# Patient Record
Sex: Female | Born: 1995 | Race: White | Hispanic: No | Marital: Single | Smoking: Never smoker
Health system: Southern US, Community
[De-identification: ages and names within clinical notes are randomized; demographics above are authoritative.]

## PROBLEM LIST (undated history)

## (undated) DIAGNOSIS — T7840XA Allergy, unspecified, initial encounter: Secondary | ICD-10-CM

## (undated) DIAGNOSIS — F419 Anxiety disorder, unspecified: Secondary | ICD-10-CM

## (undated) DIAGNOSIS — J45909 Unspecified asthma, uncomplicated: Secondary | ICD-10-CM

## (undated) HISTORY — PX: APPENDECTOMY: SHX54

## (undated) HISTORY — DX: Anxiety disorder, unspecified: F41.9

## (undated) HISTORY — DX: Allergy, unspecified, initial encounter: T78.40XA

---
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
## 2022-03-04 ENCOUNTER — Encounter (HOSPITAL_COMMUNITY): Payer: Self-pay

## 2022-03-04 ENCOUNTER — Emergency Department (HOSPITAL_COMMUNITY)
Admission: EM | Admit: 2022-03-04 | Discharge: 2022-03-04 | Disposition: A | Discharge status: Home / Self Care | Payer: Self-pay | Attending: Emergency Medicine | Admitting: Emergency Medicine

## 2022-03-04 ENCOUNTER — Other Ambulatory Visit: Payer: Self-pay

## 2022-03-04 ENCOUNTER — Emergency Department (HOSPITAL_COMMUNITY): Payer: Self-pay

## 2022-03-04 DIAGNOSIS — R112 Nausea with vomiting, unspecified: Secondary | ICD-10-CM | POA: Insufficient documentation

## 2022-03-04 DIAGNOSIS — K529 Noninfective gastroenteritis and colitis, unspecified: Secondary | ICD-10-CM | POA: Insufficient documentation

## 2022-03-04 DIAGNOSIS — R0981 Nasal congestion: Secondary | ICD-10-CM | POA: Insufficient documentation

## 2022-03-04 DIAGNOSIS — R824 Acetonuria: Secondary | ICD-10-CM | POA: Insufficient documentation

## 2022-03-04 DIAGNOSIS — J45909 Unspecified asthma, uncomplicated: Secondary | ICD-10-CM | POA: Insufficient documentation

## 2022-03-04 LAB — COMPREHENSIVE METABOLIC PANEL
ALT: 18 U/L (ref 0–44)
AST: 21 U/L (ref 15–41)
Albumin: 4.3 g/dL (ref 3.5–5.0)
Alkaline Phosphatase: 48 U/L (ref 38–126)
Anion gap: 12 (ref 5–15)
BUN: 7 mg/dL (ref 6–20)
CO2: 19 mmol/L — ABNORMAL LOW (ref 22–32)
Calcium: 9.1 mg/dL (ref 8.9–10.3)
Chloride: 106 mmol/L (ref 98–111)
Creatinine, Ser: 0.96 mg/dL (ref 0.44–1.00)
GFR, Estimated: >60 mL/min (ref >60)
Glucose, Bld: 106 mg/dL — ABNORMAL HIGH (ref 70–99)
Potassium: 3.6 mmol/L (ref 3.5–5.1)
Sodium: 137 mmol/L (ref 135–145)
Total Bilirubin: 1.1 mg/dL (ref 0.3–1.2)
Total Protein: 6.9 g/dL (ref 6.5–8.1)

## 2022-03-04 LAB — CBC WITH DIFFERENTIAL/PLATELET
Abs Immature Granulocytes: 0.04 10*3/uL (ref 0.00–0.07)
Basophils Absolute: 0 10*3/uL (ref 0.0–0.1)
Basophils Relative: 0 %
Eosinophils Absolute: 0 10*3/uL (ref 0.0–0.5)
Eosinophils Relative: 0 %
HCT: 42 % (ref 36.0–46.0)
Hemoglobin: 14.1 g/dL (ref 12.0–15.0)
Immature Granulocytes: 0 %
Lymphocytes Relative: 8 %
Lymphs Abs: 0.7 10*3/uL (ref 0.7–4.0)
MCH: 30.9 pg (ref 26.0–34.0)
MCHC: 33.6 g/dL (ref 30.0–36.0)
MCV: 92.1 fL (ref 80.0–100.0)
Monocytes Absolute: 0.7 10*3/uL (ref 0.1–1.0)
Monocytes Relative: 7 %
Neutro Abs: 8.2 10*3/uL — ABNORMAL HIGH (ref 1.7–7.7)
Neutrophils Relative %: 85 %
Platelets: 243 10*3/uL (ref 150–400)
RBC: 4.56 MIL/uL (ref 3.87–5.11)
RDW: 13.2 % (ref 11.5–15.5)
WBC: 9.7 10*3/uL (ref 4.0–10.5)
nRBC: 0 % (ref 0.0–0.2)

## 2022-03-04 LAB — URINALYSIS, ROUTINE W REFLEX MICROSCOPIC
Bilirubin Urine: NEGATIVE
Glucose, UA: NEGATIVE mg/dL
Ketones, ur: 80 mg/dL — AB
Leukocytes,Ua: NEGATIVE
Nitrite: NEGATIVE
Protein, ur: NEGATIVE mg/dL
Specific Gravity, Urine: 1.005 (ref 1.005–1.030)
pH: 6 (ref 5.0–8.0)

## 2022-03-04 LAB — LIPASE, BLOOD: Lipase: 24 U/L (ref 11–51)

## 2022-03-04 LAB — I-STAT BETA HCG BLOOD, ED (MC, WL, AP ONLY): I-stat hCG, quantitative: <5 m[IU]/mL (ref <5)

## 2022-03-04 MED ORDER — CIPROFLOXACIN HCL 500 MG PO TABS: 500.0000 mg | ORAL_TABLET | Freq: Two times a day (BID) | ORAL | 0 refills | Status: DC

## 2022-03-04 MED ORDER — CIPROFLOXACIN IN D5W 400 MG/200ML IV SOLN
400.0000 mg | Freq: Once | INTRAVENOUS | Status: AC
  Administered 2022-03-04: 400 mg via INTRAVENOUS
  Filled 2022-03-04: qty 200

## 2022-03-04 MED ORDER — IOHEXOL 300 MG/ML  SOLN
100.0000 mL | Freq: Once | INTRAMUSCULAR | Status: AC | PRN
  Administered 2022-03-04: 100 mL via INTRAVENOUS

## 2022-03-04 MED ORDER — METOCLOPRAMIDE HCL 5 MG/ML IJ SOLN
10.0000 mg | Freq: Once | INTRAMUSCULAR | Status: AC
  Administered 2022-03-04: 10 mg via INTRAVENOUS
  Filled 2022-03-04: qty 2

## 2022-03-04 MED ORDER — SODIUM CHLORIDE 0.9 % IV SOLN
25.0000 mg | Freq: Once | INTRAVENOUS | Status: AC
  Administered 2022-03-04: 25 mg via INTRAVENOUS
  Filled 2022-03-04: qty 1

## 2022-03-04 MED ORDER — METRONIDAZOLE 500 MG/100ML IV SOLN
500.0000 mg | Freq: Once | INTRAVENOUS | Status: AC
  Administered 2022-03-04: 500 mg via INTRAVENOUS
  Filled 2022-03-04: qty 100

## 2022-03-04 MED ORDER — SODIUM CHLORIDE 0.9 % IV SOLN: INTRAVENOUS | Status: DC

## 2022-03-04 MED ORDER — LACTATED RINGERS IV BOLUS
1000.0000 mL | Freq: Once | INTRAVENOUS | Status: AC
Start: 2022-03-04 — End: 2022-03-04
  Administered 2022-03-04: 1000 mL via INTRAVENOUS

## 2022-03-04 MED ORDER — ONDANSETRON HCL 4 MG/2ML IJ SOLN
INTRAMUSCULAR | Status: AC
  Filled 2022-03-04: qty 2

## 2022-03-04 MED ORDER — PROMETHAZINE HCL 25 MG PO TABS: 25.0000 mg | ORAL_TABLET | Freq: Three times a day (TID) | ORAL | 0 refills | Status: DC | PRN

## 2022-03-04 MED ORDER — ONDANSETRON 4 MG PO TBDP: 4.0000 mg | ORAL_TABLET | Freq: Three times a day (TID) | ORAL | 0 refills | Status: DC | PRN

## 2022-03-04 MED ORDER — METRONIDAZOLE 500 MG PO TABS: 500.0000 mg | ORAL_TABLET | Freq: Two times a day (BID) | ORAL | 0 refills | Status: AC

## 2022-03-04 MED ORDER — SODIUM CHLORIDE 0.9 % IV BOLUS
1000.0000 mL | Freq: Once | INTRAVENOUS | Status: AC
  Administered 2022-03-04: 1000 mL via INTRAVENOUS

## 2022-03-04 MED ORDER — ONDANSETRON HCL 4 MG/2ML IJ SOLN
4.0000 mg | Freq: Once | INTRAMUSCULAR | Status: AC
  Administered 2022-03-04: 4 mg via INTRAVENOUS
  Filled 2022-03-04: qty 2

## 2022-03-04 MED ORDER — DICYCLOMINE HCL 10 MG/ML IM SOLN
20.0000 mg | Freq: Once | INTRAMUSCULAR | Status: AC
  Administered 2022-03-04: 20 mg via INTRAMUSCULAR
  Filled 2022-03-04: qty 2

## 2022-03-04 NOTE — Discharge Instructions (Signed)
As discussed, today's evaluation has been consistent with gastritis or irritation of your large intestine.  This is a somewhat unusual finding in a young healthy individual.  Please be sure to call our gastroenterology colleagues to arrange appropriate follow-up.  Return here for concerning changes in your condition.

## 2022-03-04 NOTE — ED Triage Notes (Signed)
Pt arrives POV with complaints of n/v/d x 2 months with worsening sx this week. Pt states that she has not been able to eat or drink or hold any of the medications down that she was given at [facility] yesterday. Pt complains of cough and abdominal cramping as well.

## 2022-03-04 NOTE — ED Provider Notes (Signed)
[facility] EMERGENCY DEPARTMENT Provider Note   CSN: [id number] Arrival date & time: 03/04/22  0847     History  Chief Complaint  Patient presents with   Abdominal Pain   Nausea    Makayla Obrien is a 26 y.o. female.  HPI 26 yo female with history of asthma presents with cough for 2 days and persistent epigastric pain with n/v/d for 2 months. The patient was seen at urgent care 1 week ago for n/v/d and was diagnosed with IBS, she was given medication for nausea but was unable to hold it down due to vomiting. She returned to urgent care 2 days ago because she developed a cough and was given benzonatate, azithromycin, prednisone, and pantoprazole. She also had labs drawn for a GI infection which are still pending. She has not been able to hold down any medications. Patient also complains of fever, chills, nasal congestion. Denies chest pain, dysuria, hematuria. Previous surgeries include appendectomy in 2007.     Home Medications Prior to Admission medications   Medication Sig Start Date End Date Taking? Authorizing Provider  ciprofloxacin (CIPRO) 500 MG tablet Take 1 tablet (500 mg total) by mouth every 12 (twelve) hours. 03/04/22  Yes Gerhard Munch, MD  metroNIDAZOLE (FLAGYL) 500 MG tablet Take 1 tablet (500 mg total) by mouth 2 (two) times daily for 5 days. 03/04/22 03/09/22 Yes Gerhard Munch, MD  ondansetron (ZOFRAN-ODT) 4 MG disintegrating tablet Take 1 tablet (4 mg total) by mouth every 8 (eight) hours as needed for nausea or vomiting. 03/04/22  Yes Gerhard Munch, MD      Allergies    Patient has no known allergies.    Review of Systems   Review of Systems  Constitutional:        Per HPI, otherwise negative  HENT:         Per HPI, otherwise negative  Respiratory:         Per HPI, otherwise negative  Cardiovascular:        Per HPI, otherwise negative  Gastrointestinal:  Positive for nausea and vomiting.  Endocrine:       Negative aside from HPI   Genitourinary:        Neg aside from HPI   Musculoskeletal:        Per HPI, otherwise negative  Skin: Negative.   Neurological:  Negative for syncope.   Physical Exam Updated Vital Signs BP (!) 126/99   Pulse 79   Temp 98.4 F (36.9 C) (Oral)   Resp 16   Ht 5\' 6"  (1.676 m)   Wt 68.9 kg   LMP 02/24/2022   SpO2 99%   BMI 24.53 kg/m  Physical Exam Vitals and nursing note reviewed.  Constitutional:      General: She is not in acute distress.    Appearance: She is well-developed.  HENT:     Head: Normocephalic and atraumatic.  Eyes:     Conjunctiva/sclera: Conjunctivae normal.  Cardiovascular:     Rate and Rhythm: Normal rate and regular rhythm.  Pulmonary:     Effort: Pulmonary effort is normal. No respiratory distress.     Breath sounds: Normal breath sounds. No stridor.  Abdominal:     General: There is no distension.     Tenderness: There is generalized abdominal tenderness. There is guarding.  Skin:    General: Skin is warm and dry.  Neurological:     Mental Status: She is alert and oriented to person, place, and time.  Cranial Nerves: No cranial nerve deficit.  Psychiatric:        Mood and Affect: Mood normal.    ED Results / Procedures / Treatments   Labs (all labs ordered are listed, but only abnormal results are displayed) Labs Reviewed  COMPREHENSIVE METABOLIC PANEL - Abnormal; Notable for the following components:      Result Value   CO2 19 (*)    Glucose, Bld 106 (*)    All other components within normal limits  CBC WITH DIFFERENTIAL/PLATELET - Abnormal; Notable for the following components:   Neutro Abs 8.2 (*)    All other components within normal limits  URINALYSIS, ROUTINE W REFLEX MICROSCOPIC - Abnormal; Notable for the following components:   Color, Urine STRAW (*)    Hgb urine dipstick MODERATE (*)    Ketones, ur 80 (*)    Bacteria, UA RARE (*)    All other components within normal limits  LIPASE, BLOOD  I-STAT BETA HCG BLOOD, ED  ([facility], [facility], [facility] ONLY)    EKG None  Radiology DG Chest 2 View  Result Date: 03/04/2022 CLINICAL DATA:  cough EXAM: CHEST - 2 VIEW COMPARISON:  Chest radiograph dated October 14, 2014 FINDINGS: The cardiomediastinal silhouette is normal in contour. No pleural effusion. No pneumothorax. No acute pleuroparenchymal abnormality. Visualized abdomen is unremarkable. Dextroscoliosis of the thoracic spine. IMPRESSION: No acute cardiopulmonary abnormality. Electronically Signed   By: Meda KlinefelterStephanie  Peacock M.D.   On: 03/04/2022 11:14   CT ABDOMEN PELVIS W CONTRAST  Result Date: 03/04/2022 CLINICAL DATA:  Postop nausea and vomiting. EXAM: CT ABDOMEN AND PELVIS WITH CONTRAST TECHNIQUE: Multidetector CT imaging of the abdomen and pelvis was performed using the standard protocol following bolus administration of intravenous contrast. RADIATION DOSE REDUCTION: This exam was performed according to the departmental dose-optimization program which includes automated exposure control, adjustment of the mA and/or kV according to patient size and/or use of iterative reconstruction technique. CONTRAST:  100mL OMNIPAQUE IOHEXOL 300 MG/ML  SOLN COMPARISON:  None Available. FINDINGS: Lower chest: Patchy ground-glass nodular opacities in the left lung base are likely infectious or inflammatory. Hepatobiliary: Suspicious hepatic lesion. Gallbladder is unremarkable. No biliary ductal dilation. Pancreas: No pancreatic ductal dilation or evidence of acute inflammation. Spleen: Normal in size without focal abnormality. Adrenals/Urinary Tract: Bilateral adrenal glands appear normal. No hydronephrosis. Kidneys demonstrate symmetric enhancement. Urinary bladder is unremarkable for degree of distension. Stomach/Bowel: No radiopaque enteric contrast material was administered. Stomach is unremarkable for degree of distension. No pathologic dilation of small or large bowel. Appendix appears surgically absent. Mild wall thickening of the ascending and  transverse colon some adjacent inflammation. Vascular/Lymphatic: No significant vascular findings are present. No enlarged abdominal or pelvic lymph nodes. Reproductive: Intrauterine device in place. No suspicious adnexal mass. Other: Trace pelvic free fluid is within physiologic normal limits. Musculoskeletal: No acute or significant osseous findings. IMPRESSION: 1. Mild ascending and transverse colonic wall thickening with some adjacent stranding may reflect mild colitis. 2. Patchy nodular ground-glass opacities in the left lung base are likely infectious or inflammatory. Electronically Signed   By: Maudry MayhewJeffrey  Waltz M.D.   On: 03/04/2022 11:27    Procedures Procedures    Medications Ordered in ED Medications  sodium chloride 0.9 % bolus 1,000 mL (0 mLs Intravenous Stopped 03/04/22 1527)    And  0.9 %  sodium chloride infusion ( Intravenous New Bag/Given 03/04/22 1307)  ondansetron (ZOFRAN) 4 MG/2ML injection (has no administration in time range)  ondansetron (ZOFRAN) injection 4 mg (4 mg  Intravenous Given 03/04/22 1000)  dicyclomine (BENTYL) injection 20 mg (20 mg Intramuscular Given 03/04/22 1253)  iohexol (OMNIPAQUE) 300 MG/ML solution 100 mL (100 mLs Intravenous Contrast Given 03/04/22 1119)  ciprofloxacin (CIPRO) IVPB 400 mg (0 mg Intravenous Stopped 03/04/22 1409)  metroNIDAZOLE (FLAGYL) IVPB 500 mg (0 mg Intravenous Stopped 03/04/22 1522)  lactated ringers bolus 1,000 mL (1,000 mLs Intravenous New Bag/Given 03/04/22 1526)  metoCLOPramide (REGLAN) injection 10 mg (10 mg Intravenous Given 03/04/22 1525)    ED Course/ Medical Decision Making/ A&P  This patient with a Hx of asthma presents to the ED for concern of abdominal pain, nausea, vomiting, cough, this involves an extensive number of treatment options, and is a complaint that carries with it a high risk of complications and morbidity.    The differential diagnosis includes both intrathoracic and intra-abdominal pathology including pneumonia,  gastroesophageal etiology, hepatobiliary dysfunction perforation   Social Determinants of Health:  No limits  Additional history obtained:  Additional history and/or information obtained from mother, notable for HPI, history of prior testing, and medications   After the initial evaluation, orders, including: Labs x-ray CT fluids Bentyl antiemetics were initiated.   Patient placed on Cardiac and Pulse-Oximetry Monitors. The patient was maintained on a cardiac monitor.  The cardiac monitored showed an rhythm of 75 sinus normal The patient was also maintained on pulse oximetry. The readings were typically 100% room air normal   On repeat evaluation of the patient improved 4:33 PM Patient with marked better, awake, alert, hemodynamically unremarkable Lab Tests:  I personally interpreted labs.  The pertinent results include: Ketonuria  Imaging Studies ordered:  I independently visualized and interpreted imaging which showed colitis I agree with the radiologist interpretation   Dispostion / Final MDM:  After consideration of the diagnostic results and the patient's response to treatment, this young adult female is presenting with ongoing abdominal pain, nausea, anorexia.  After several urgent care visits, with ongoing symptoms, she has not yet seen a GI.  Presumptive diagnosis of IBS remains a consideration, here patient found to have evidence for colitis, no perforation, no abscess on CT.  She is afebrile, no leukocytosis, started on antibiotics, though, as above, GI follow-up with IBS eval is a suggestion, patient and her mother were amenable to this.  After substantial fluid resuscitation here, with similar improvement in her clinical condition, no distress, no additional vomiting, patient discharged in stable condition.   Final Clinical Impression(s) / ED Diagnoses Final diagnoses:  Colitis    Rx / DC Orders ED Discharge Orders          Ordered    ciprofloxacin (CIPRO)  500 MG tablet  Every 12 hours        03/04/22 1632    metroNIDAZOLE (FLAGYL) 500 MG tablet  2 times daily        03/04/22 1632    ondansetron (ZOFRAN-ODT) 4 MG disintegrating tablet  Every 8 hours PRN        03/04/22 1632              Gerhard Munch, MD 03/04/22 [phone]

## 2022-03-05 ENCOUNTER — Encounter (HOSPITAL_COMMUNITY): Payer: Self-pay | Admitting: Emergency Medicine

## 2022-03-05 ENCOUNTER — Emergency Department (HOSPITAL_COMMUNITY): Payer: PRIVATE HEALTH INSURANCE

## 2022-03-05 ENCOUNTER — Emergency Department (HOSPITAL_COMMUNITY)
Admission: EM | Admit: 2022-03-05 | Discharge: 2022-03-06 | Disposition: A | Discharge status: Home / Self Care | Payer: PRIVATE HEALTH INSURANCE | Attending: Emergency Medicine | Admitting: Emergency Medicine

## 2022-03-05 ENCOUNTER — Other Ambulatory Visit: Payer: Self-pay

## 2022-03-05 DIAGNOSIS — J189 Pneumonia, unspecified organism: Secondary | ICD-10-CM | POA: Insufficient documentation

## 2022-03-05 DIAGNOSIS — R112 Nausea with vomiting, unspecified: Secondary | ICD-10-CM

## 2022-03-05 DIAGNOSIS — K529 Noninfective gastroenteritis and colitis, unspecified: Secondary | ICD-10-CM | POA: Insufficient documentation

## 2022-03-05 LAB — COMPREHENSIVE METABOLIC PANEL
ALT: 19 U/L (ref 0–44)
AST: 26 U/L (ref 15–41)
Albumin: 3.9 g/dL (ref 3.5–5.0)
Alkaline Phosphatase: 43 U/L (ref 38–126)
Anion gap: 11 (ref 5–15)
BUN: 8 mg/dL (ref 6–20)
CO2: 20 mmol/L — ABNORMAL LOW (ref 22–32)
Calcium: 8.9 mg/dL (ref 8.9–10.3)
Chloride: 108 mmol/L (ref 98–111)
Creatinine, Ser: 0.97 mg/dL (ref 0.44–1.00)
GFR, Estimated: >60 mL/min (ref >60)
Glucose, Bld: 107 mg/dL — ABNORMAL HIGH (ref 70–99)
Potassium: 3.5 mmol/L (ref 3.5–5.1)
Sodium: 139 mmol/L (ref 135–145)
Total Bilirubin: 0.8 mg/dL (ref 0.3–1.2)
Total Protein: 6.5 g/dL (ref 6.5–8.1)

## 2022-03-05 LAB — CBC WITH DIFFERENTIAL/PLATELET
Abs Immature Granulocytes: 0.02 10*3/uL (ref 0.00–0.07)
Basophils Absolute: 0 10*3/uL (ref 0.0–0.1)
Basophils Relative: 0 %
Eosinophils Absolute: 0.2 10*3/uL (ref 0.0–0.5)
Eosinophils Relative: 2 %
HCT: 42.2 % (ref 36.0–46.0)
Hemoglobin: 14.1 g/dL (ref 12.0–15.0)
Immature Granulocytes: 0 %
Lymphocytes Relative: 26 %
Lymphs Abs: 2.4 10*3/uL (ref 0.7–4.0)
MCH: 30.9 pg (ref 26.0–34.0)
MCHC: 33.4 g/dL (ref 30.0–36.0)
MCV: 92.5 fL (ref 80.0–100.0)
Monocytes Absolute: 1.1 10*3/uL — ABNORMAL HIGH (ref 0.1–1.0)
Monocytes Relative: 12 %
Neutro Abs: 5.6 10*3/uL (ref 1.7–7.7)
Neutrophils Relative %: 60 %
Platelets: 263 10*3/uL (ref 150–400)
RBC: 4.56 MIL/uL (ref 3.87–5.11)
RDW: 13.2 % (ref 11.5–15.5)
WBC: 9.3 10*3/uL (ref 4.0–10.5)
nRBC: 0 % (ref 0.0–0.2)

## 2022-03-05 LAB — URINALYSIS, ROUTINE W REFLEX MICROSCOPIC
Bilirubin Urine: NEGATIVE
Glucose, UA: NEGATIVE mg/dL
Ketones, ur: 5 mg/dL — AB
Nitrite: NEGATIVE
Protein, ur: NEGATIVE mg/dL
Specific Gravity, Urine: 1.021 (ref 1.005–1.030)
pH: 7 (ref 5.0–8.0)

## 2022-03-05 NOTE — ED Provider Triage Note (Signed)
Emergency Medicine Provider Triage Evaluation Note  Chastelyn Athens , a 26 y.o. female  was evaluated in triage.  Pt complains of abdominal pain.  Patient is recently been seen multiple times since her urgent care exam yesterday here in the emergency department.  Persistent since her discharge.  She has been consistently nauseous and vomiting and has not been able to keep her antibiotic she was discharged with.  She also notes continued diarrhea.  She also notes excessive coughing for the past 4 days with possible aspiration.  Denies fever, chills, night sweats  Review of Systems  Positive: See above Negative:   Physical Exam  Ht 5\' 6"  (1.676 m)   Wt 76 kg   LMP 02/24/2022   BMI 27.04 kg/m  Gen:   Awake, no distress   Resp:  Normal effort.  Faint wheeze noted upon auscultation.   MSK:   Moves extremities without difficulty  Other:  Epigastric abdominal tenderness  Medical Decision Making  Medically screening exam initiated at 10:03 PM.  Appropriate orders placed.  Itzabella Sorrels was informed that the remainder of the evaluation will be completed by another provider, this initial triage assessment does not replace that evaluation, and the importance of remaining in the ED until their evaluation is complete.     Arlana Lindau, Peter Garter 03/05/22 2205

## 2022-03-05 NOTE — ED Triage Notes (Signed)
Patient reports persistent upper abdominal pain with emesis and diarrhea for several weeks diagnosed with colitis here yesterday prescribed with medications with no improvement , denies fever or chills.

## 2022-03-06 ENCOUNTER — Encounter: Payer: Self-pay | Admitting: Physician Assistant

## 2022-03-06 MED ORDER — AMOXICILLIN-POT CLAVULANATE 875-125 MG PO TABS: 1.0000 | ORAL_TABLET | Freq: Once | ORAL | Status: DC

## 2022-03-06 MED ORDER — LACTATED RINGERS IV BOLUS: 1000.0000 mL | Freq: Once | INTRAVENOUS | Status: DC

## 2022-03-06 MED ORDER — METOCLOPRAMIDE HCL 5 MG/ML IJ SOLN
10.0000 mg | Freq: Once | INTRAMUSCULAR | Status: AC
Start: 2022-03-06 — End: 2022-03-06
  Administered 2022-03-06: 10 mg via INTRAVENOUS
  Filled 2022-03-06: qty 2

## 2022-03-06 MED ORDER — METOCLOPRAMIDE HCL 10 MG PO TABS: 10.0000 mg | ORAL_TABLET | Freq: Four times a day (QID) | ORAL | 0 refills | Status: DC

## 2022-03-06 MED ORDER — LACTATED RINGERS IV BOLUS
1000.0000 mL | Freq: Once | INTRAVENOUS | Status: AC
  Administered 2022-03-06: 1000 mL via INTRAVENOUS

## 2022-03-06 MED ORDER — METRONIDAZOLE 500 MG PO TABS
500.0000 mg | ORAL_TABLET | Freq: Once | ORAL | Status: AC
  Administered 2022-03-06: 500 mg via ORAL
  Filled 2022-03-06: qty 1

## 2022-03-06 MED ORDER — CIPROFLOXACIN HCL 500 MG PO TABS
500.0000 mg | ORAL_TABLET | Freq: Once | ORAL | Status: AC
Start: 2022-03-06 — End: 2022-03-06
  Administered 2022-03-06: 500 mg via ORAL
  Filled 2022-03-06: qty 1

## 2022-03-06 NOTE — ED Notes (Signed)
Provided pt with water and crackers for PO challenge.  

## 2022-03-06 NOTE — ED Provider Notes (Signed)
[facility] EMERGENCY DEPARTMENT Provider Note   CSN: [id number] Arrival date & time: 03/05/22  2133     History  Chief Complaint  Patient presents with   Abdominal Pain   Emesis    Diarrhea    Makayla Obrien is a 26 y.o. female.  Patient presents to the emergency department for abdominal pain, nausea, vomiting, and diarrhea.  Of note, patient was seen in the emergency department 2 days ago and had a CT imaging at that time which demonstrated basilar pneumonia as well as mild colitis.  Patient has had GI symptoms intermittently over the past 2 months but more persistently in the past 2 weeks.  This includes vomiting every day and having several episodes of watery, nonbloody diarrhea.  She does report recent travel, however her symptoms started prior to this.  She has intermittent pain in the epigastrium.  No fevers.  Several days ago she developed a cough and was seen at an urgent care.  She was started on a azithromycin.  She has been unable to keep down her medications over the past 2 days and states that she has been vomiting after eating and drinking.  She is kept down very small amounts of fluids.  She continues to have diarrhea.  She states that she feels lightheaded with standing.  No chest pain or shortness of breath.  She denies history of inflammatory bowel disease.  She denies heavy NSAID use or use of alcohol.  Reports several negative pregnancy test recently.      Home Medications Prior to Admission medications   Medication Sig Start Date End Date Taking? Authorizing Provider  ciprofloxacin (CIPRO) 500 MG tablet Take 1 tablet (500 mg total) by mouth every 12 (twelve) hours. 03/04/22   Gerhard Munch, MD  metroNIDAZOLE (FLAGYL) 500 MG tablet Take 1 tablet (500 mg total) by mouth 2 (two) times daily for 5 days. 03/04/22 03/09/22  Gerhard Munch, MD  ondansetron (ZOFRAN-ODT) 4 MG disintegrating tablet Take 1 tablet (4 mg total) by mouth every 8 (eight)  hours as needed for nausea or vomiting. 03/04/22   Gerhard Munch, MD  promethazine (PHENERGAN) 25 MG tablet Take 1 tablet (25 mg total) by mouth every 8 (eight) hours as needed for nausea or vomiting. 03/04/22   Tegeler, Canary Brim, MD      Allergies    Patient has no known allergies.    Review of Systems   Review of Systems  Physical Exam Updated Vital Signs BP 137/74 (BP Location: Right Arm)   Pulse 81   Temp 98.3 F (36.8 C)   Resp 18   Ht 5\' 6"  (1.676 m)   Wt 76 kg   LMP 02/24/2022   SpO2 100%   BMI 27.04 kg/m  Physical Exam Vitals and nursing note reviewed.  Constitutional:      General: She is not in acute distress.    Appearance: She is well-developed.  HENT:     Head: Normocephalic and atraumatic.     Right Ear: External ear normal.     Left Ear: External ear normal.     Nose: Nose normal.  Eyes:     Conjunctiva/sclera: Conjunctivae normal.  Cardiovascular:     Rate and Rhythm: Normal rate and regular rhythm.     Heart sounds: No murmur heard. Pulmonary:     Effort: No respiratory distress.     Breath sounds: No wheezing, rhonchi or rales.  Abdominal:     Palpations: Abdomen is soft.  Tenderness: There is abdominal tenderness (Mild) in the epigastric area. There is no guarding or rebound.  Musculoskeletal:     Cervical back: Normal range of motion and neck supple.     Right lower leg: No edema.     Left lower leg: No edema.  Skin:    General: Skin is warm and dry.     Findings: No rash.  Neurological:     General: No focal deficit present.     Mental Status: She is alert. Mental status is at baseline.     Motor: No weakness.  Psychiatric:        Mood and Affect: Mood normal.    ED Results / Procedures / Treatments   Labs (all labs ordered are listed, but only abnormal results are displayed) Labs Reviewed  COMPREHENSIVE METABOLIC PANEL - Abnormal; Notable for the following components:      Result Value   CO2 20 (*)    Glucose, Bld 107 (*)     All other components within normal limits  CBC WITH DIFFERENTIAL/PLATELET - Abnormal; Notable for the following components:   Monocytes Absolute 1.1 (*)    All other components within normal limits  URINALYSIS, ROUTINE W REFLEX MICROSCOPIC - Abnormal; Notable for the following components:   APPearance HAZY (*)    Hgb urine dipstick MODERATE (*)    Ketones, ur 5 (*)    Leukocytes,Ua SMALL (*)    Bacteria, UA RARE (*)    All other components within normal limits    EKG None  Radiology DG Chest 2 View  Result Date: 03/05/2022 CLINICAL DATA:  Cough. EXAM: CHEST - 2 VIEW COMPARISON:  03/04/2022. FINDINGS: The heart size and mediastinal contours are within normal limits. No consolidation, effusion, or pneumothorax. There is dextroscoliosis of the thoracic spine. No acute osseous abnormality. IMPRESSION: No active cardiopulmonary disease. Electronically Signed   By: Thornell Sartorius M.D.   On: 03/05/2022 22:31   DG Chest 2 View  Result Date: 03/04/2022 CLINICAL DATA:  cough EXAM: CHEST - 2 VIEW COMPARISON:  Chest radiograph dated October 14, 2014 FINDINGS: The cardiomediastinal silhouette is normal in contour. No pleural effusion. No pneumothorax. No acute pleuroparenchymal abnormality. Visualized abdomen is unremarkable. Dextroscoliosis of the thoracic spine. IMPRESSION: No acute cardiopulmonary abnormality. Electronically Signed   By: Meda Klinefelter M.D.   On: 03/04/2022 11:14   CT ABDOMEN PELVIS W CONTRAST  Result Date: 03/04/2022 CLINICAL DATA:  Postop nausea and vomiting. EXAM: CT ABDOMEN AND PELVIS WITH CONTRAST TECHNIQUE: Multidetector CT imaging of the abdomen and pelvis was performed using the standard protocol following bolus administration of intravenous contrast. RADIATION DOSE REDUCTION: This exam was performed according to the departmental dose-optimization program which includes automated exposure control, adjustment of the mA and/or kV according to patient size and/or use of  iterative reconstruction technique. CONTRAST:  OMNIPAQUE IOHEXOL 300 MG/ML  SOLN COMPARISON:  None Available. FINDINGS: Lower chest: Patchy ground-glass nodular opacities in the left lung base are likely infectious or inflammatory. Hepatobiliary: Suspicious hepatic lesion. Gallbladder is unremarkable. No biliary ductal dilation. Pancreas: No pancreatic ductal dilation or evidence of acute inflammation. Spleen: Normal in size without focal abnormality. Adrenals/Urinary Tract: Bilateral adrenal glands appear normal. No hydronephrosis. Kidneys demonstrate symmetric enhancement. Urinary bladder is unremarkable for degree of distension. Stomach/Bowel: No radiopaque enteric contrast material was administered. Stomach is unremarkable for degree of distension. No pathologic dilation of small or large bowel. Appendix appears surgically absent. Mild wall thickening of the ascending and transverse colon  some adjacent inflammation. Vascular/Lymphatic: No significant vascular findings are present. No enlarged abdominal or pelvic lymph nodes. Reproductive: Intrauterine device in place. No suspicious adnexal mass. Other: Trace pelvic free fluid is within physiologic normal limits. Musculoskeletal: No acute or significant osseous findings. IMPRESSION: 1. Mild ascending and transverse colonic wall thickening with some adjacent stranding may reflect mild colitis. 2. Patchy nodular ground-glass opacities in the left lung base are likely infectious or inflammatory. Electronically Signed   By: Maudry Mayhew M.D.   On: 03/04/2022 11:27    Procedures Procedures    Medications Ordered in ED Medications  lactated ringers bolus 1,000 mL (has no administration in time range)  lactated ringers bolus 1,000 mL (has no administration in time range)  metoCLOPramide (REGLAN) injection 10 mg (has no administration in time range)    ED Course/ Medical Decision Making/ A&P    Patient seen and examined. History obtained directly  from patient.  Obtained additional history from family at bedside as well as recent emergency department notes and imaging.  Work-up including labs, imaging, EKG ordered in triage, if performed, were reviewed.    Labs/EKG: Independently reviewed and interpreted.  This included: CBC with normal white blood cell count otherwise unremarkable; CMP normal electrolytes, glucose 107, bicarb slightly low at 20 with normal anion gap; UA not a clean-catch, does have some white blood cells.  Recent lipase and pregnancy were both negative.  Imaging: Independently visualized and interpreted.  This included: Chest x-ray, agree negative.  Medications/Fluids: Ordered: Lactated Ringer's 2L, IV Reglan.  Most recent vital signs reviewed and are as follows: BP 137/74 (BP Location: Right Arm)   Pulse 81   Temp 98.3 F (36.8 C)   Resp 18   Ht  (1.676 m)   Wt 76 kg   LMP 02/24/2022   SpO2 100%   BMI 27.04 kg/m   Initial impression: Ongoing GI symptoms, recent diagnosis of colitis and pneumonia.  Ongoing antibiotics use.  Main issue currently is persistent vomiting and unable to hold down antibiotics.  Will give fluid bolus, antiemetics, reassess.  9:13 AM Will check orthostatics at completion of fluid bolus. Will give PO fluids and oral med trial.   Orthostatic VS for the past 24 hrs:  BP- Lying Pulse- Lying BP- Sitting Pulse- Sitting BP- Standing at 0 minutes Pulse- Standing at 0 minutes  03/06/22 0954 130/86 68 123/80 74 132/77 74    10:10 AM Reassessment performed. Patient appears well.  She states that she is feeling better after fluids.  She was able to stand for orthostatic vital signs and did well.  She is tolerating fluids crackers and her antibiotics.  Reviewed pertinent lab work and imaging with patient and family at bedside.  They are comfortable with discharge to home.  Questions answered.   Most current vital signs reviewed and are as follows: BP 122/88   Pulse 80   Temp 98.3 F (36.8  C)   Resp 16   Ht  (1.676 m)   Wt 76 kg   LMP 02/24/2022   SpO2 100%   BMI 27.04 kg/m   Plan: Discharge to home.   Prescriptions written for: P.o. Reglan  Other home care instructions discussed: Clear liquids and bland diet, take antiemetics prior to antibiotics  ED return instructions discussed: The patient was urged to return to the Emergency Department immediately with worsening of current symptoms, worsening abdominal pain, persistent vomiting, blood noted in stools, fever, or any other concerns. The patient verbalized understanding.  Follow-up instructions discussed: Patient encouraged to follow-up with their PCP in 3 days.                            Medical Decision Making Risk Prescription drug management.   For this patient's complaint of abdominal pain, the following conditions were considered on the differential diagnosis: gastritis/PUD, enteritis/duodenitis, appendicitis, cholelithiasis/cholecystitis, cholangitis, pancreatitis, ruptured viscus, colitis, diverticulitis, small/large bowel obstruction, proctitis, cystitis, pyelonephritis, ureteral colic, aortic dissection, aortic aneurysm. In women, ectopic pregnancy, pelvic inflammatory disease, ovarian cysts, and tubo-ovarian abscess were also considered. Atypical chest etiologies were also considered including ACS, PE, and pneumonia.   Main concern today was persistent vomiting despite home meds.  She was unable to keep down her antibiotics.  She was given hydration and antiemetics here with good results.  She looks well and is comfortable with another trial at home.  The patient's vital signs, pertinent lab work and imaging were reviewed and interpreted as discussed in the ED course. Hospitalization was considered for further testing, treatments, or serial exams/observation. However as patient is well-appearing, has a stable exam, and reassuring studies today, I do not feel that they warrant admission at this time.  This plan was discussed with the patient who verbalizes agreement and comfort with this plan and seems reliable and able to return to the Emergency Department with worsening or changing symptoms.          Final Clinical Impression(s) / ED Diagnoses Final diagnoses:  Colitis  Community acquired pneumonia, unspecified laterality  Nausea vomiting and diarrhea    Rx / DC Orders ED Discharge Orders          Ordered    metoCLOPramide (REGLAN) 10 MG tablet  Every 6 hours        03/06/22 1008              Renne CriglerGeiple, Seferino Oscar, PA-C 03/06/22 1012    Gloris Manchesterixon, Ryan, MD 03/06/22 1232

## 2022-03-06 NOTE — Discharge Instructions (Addendum)
Please read and follow all provided instructions.  Your diagnoses today include:  1. Colitis   2. Community acquired pneumonia, unspecified laterality   3. Nausea vomiting and diarrhea     Tests performed today include: Blood cell counts and platelets: White and red blood cell counts looked normal today Kidney and liver function tests: Liver and kidney function were normal Urine test to look for infection: No infection, shows mild dehydration Vital signs. See below for your results today.   Medications prescribed:  Reglan: Medication for nausea and vomiting  Take any prescribed medications only as directed.  Home care instructions:  Follow any educational materials contained in this packet. It may be helpful to take the Reglan tablet 30 minutes to 1 hour prior to taking the antibiotics Stick with clear liquids and bland diet over the next several days as you recover Call GI referral given to you at previous visit to schedule an appointment  Follow-up instructions: Please follow-up with your primary care provider in the next 3 days for further evaluation of your symptoms.    Return instructions:  SEEK IMMEDIATE MEDICAL ATTENTION IF: The pain does not go away or becomes severe  A temperature above 101F develops  Repeated vomiting occurs (multiple episodes)  The pain becomes localized to portions of the abdomen. The right side could possibly be appendicitis. In an adult, the left lower portion of the abdomen could be colitis or diverticulitis.  Blood is being passed in stools or vomit (bright red or black tarry stools)  You develop chest pain, difficulty breathing, dizziness or fainting, or become confused, poorly responsive, or inconsolable (young children) If you have any other emergent concerns regarding your health  Additional Information: Abdominal (belly) pain can be caused by many things. Your caregiver performed an examination and possibly ordered blood/urine tests and  imaging (CT scan, x-rays, ultrasound). Many cases can be observed and treated at home after initial evaluation in the emergency department. Even though you are being discharged home, abdominal pain can be unpredictable. Therefore, you need a repeated exam if your pain does not resolve, returns, or worsens. Most patients with abdominal pain don't have to be admitted to the hospital or have surgery, but serious problems like appendicitis and gallbladder attacks can start out as nonspecific pain. Many abdominal conditions cannot be diagnosed in one visit, so follow-up evaluations are very important.  Your vital signs today were: BP 122/88   Pulse 80   Temp 98.3 F (36.8 C)   Resp 16   Ht 5\' 6"  (1.676 m)   Wt 76 kg   LMP 02/24/2022   SpO2 100%   BMI 27.04 kg/m  If your blood pressure (bp) was elevated above 135/85 this visit, please have this repeated by your doctor within one month. --------------

## 2022-03-28 ENCOUNTER — Ambulatory Visit: Payer: Self-pay | Admitting: Physician Assistant

## 2022-04-23 ENCOUNTER — Ambulatory Visit: Payer: Self-pay | Admitting: Physician Assistant

## 2022-05-28 ENCOUNTER — Ambulatory Visit: Payer: BC Managed Care – PPO | Admitting: Physician Assistant

## 2022-05-28 ENCOUNTER — Encounter: Payer: Self-pay | Admitting: Physician Assistant

## 2022-05-28 VITALS — BP 110/62 | HR 70 | Ht 66.0 in | Wt 154.0 lb

## 2022-05-28 DIAGNOSIS — R197 Diarrhea, unspecified: Secondary | ICD-10-CM

## 2022-05-28 DIAGNOSIS — R112 Nausea with vomiting, unspecified: Secondary | ICD-10-CM | POA: Diagnosis not present

## 2022-05-28 DIAGNOSIS — R1084 Generalized abdominal pain: Secondary | ICD-10-CM

## 2022-05-28 DIAGNOSIS — R935 Abnormal findings on diagnostic imaging of other abdominal regions, including retroperitoneum: Secondary | ICD-10-CM | POA: Diagnosis not present

## 2022-05-28 MED ORDER — METOCLOPRAMIDE HCL 10 MG PO TABS: ORAL_TABLET | ORAL | 0 refills | Status: DC

## 2022-05-28 MED ORDER — PROMETHAZINE HCL 25 MG PO TABS: 25.0000 mg | ORAL_TABLET | Freq: Two times a day (BID) | ORAL | 2 refills | Status: DC

## 2022-05-28 MED ORDER — NA SULFATE-K SULFATE-MG SULF 17.5-3.13-1.6 GM/177ML PO SOLN
1.0000 | Freq: Once | ORAL | 0 refills | Status: AC
Start: 2022-05-28 — End: 2022-05-28

## 2022-05-28 NOTE — Progress Notes (Signed)
Reviewed and agree with management plans. ? ?Catheryn Slifer L. Roshawn Lacina, MD, MPH  ?

## 2022-05-28 NOTE — Progress Notes (Signed)
Chief Complaint: Follow-up after ED visit for abdominal pain and nausea  HPI:    Makayla Obrien is a 26 year old female with a past medical history as listed below including asthma, who was referred to me by Shellia Cleverly, PA for follow-up after being seen in the ED for abdominal pain and nausea.    03/04/2022 patient seen in the ER for a cough for 2 days and persistent epigastric pain with nausea vomiting and diarrhea for 2 months.  Apparently had been seen in the urgent care a week prior and was diagnosed with IBS and given medication for nausea but was unable to hold it down due to vomiting.  She returned to the urgent care 2 days prior to ER visit and had developed a cough and was given benzonatate, azithromycin, prednisone and pantoprazole.  She also had labs drawn for GI infection which were pending.  CMP with a glucose of 106 and otherwise normal.  CBC normal.  Lipase normal. CT abdomen pelvis with contrast showed mild a sending and transverse colonic wall thickening with some adjacent stranding which may reflect mild colitis and patchy nodular groundglass opacities in the left lung base which were likely infectious or inflammatory.  At that time patient given Cipro and Flagyl as well as Zofran.    03/05/2022 patient seen in the ED for abdominal pain, nausea vomiting and diarrhea.  At that time continued to have diarrhea and vomiting.  Labs with a glucose of 107, otherwise normal CMP, CBC.  That time given Reglan.    Today, patient is seen accompanied by her mother who assists with history.  She explains that for years now she has had trouble with nausea and generalized abdominal pain as well as diarrhea, but in October and/November 2022 all of this seemed to get worse where she really could not tolerate eating hardly anything and started to lose a lot of weight.  Since then the patient has lost a total of 100 pounds.  Tells me that she also had worsening of symptoms in June when she was seen in the ED  as above that she was "vomiting constantly", she thought she was possibly pregnant even though she has an IUD but was told that she had colitis.  Tells me that her regular symptoms consist of daily nausea and vomiting right when she wakes up in the morning, she will then use the Promethazine 25 mg which seems to help, she does not eat breakfast due to feeling so bad and then around lunch will have some grilled chicken or a small cheese quesadilla which she seems to tolerate okay, she then does okay for dinner and takes another Promethazine before going to bed.  In between all of this tells me that she has generalized abdominal pain and cramping at all times, sometimes worsened and episodes of vomiting.  She will also have diarrhea at least 2-3 times a day.  Only very occasionally will it have any form.  Describes a cousin of hers that has Crohn's disease and a grandmother with a lot of polyps.  No immediate family history of IBD.    Denies fever, chills, blood in her stool or symptoms that awaken her from sleep.  Past Medical History:  Diagnosis Date   Asthma     No past surgical history on file.  Current Outpatient Medications  Medication Sig Dispense Refill   ciprofloxacin (CIPRO) 500 MG tablet Take 1 tablet (500 mg total) by mouth every 12 (twelve) hours. 10  tablet 0   metoCLOPramide (REGLAN) 10 MG tablet Take 1 tablet (10 mg total) by mouth every 6 (six) hours. 20 tablet 0   ondansetron (ZOFRAN-ODT) 4 MG disintegrating tablet Take 1 tablet (4 mg total) by mouth every 8 (eight) hours as needed for nausea or vomiting. 20 tablet 0   promethazine (PHENERGAN) 25 MG tablet Take 1 tablet (25 mg total) by mouth every 8 (eight) hours as needed for nausea or vomiting. 24 tablet 0   No current facility-administered medications for this visit.    Allergies as of 05/28/2022   (No Known Allergies)    No family history on file.  Social History   Socioeconomic History   Marital status: Single     Spouse name: Not on file   Number of children: Not on file   Years of education: Not on file   Highest education level: Not on file  Occupational History   Not on file  Tobacco Use   Smoking status: Never   Smokeless tobacco: Never  Substance and Sexual Activity   Alcohol use: Never   Drug use: Never   Sexual activity: Not on file  Other Topics Concern   Not on file  Social History Narrative   Not on file   Social Determinants of Health   Financial Resource Strain: Not on file  Food Insecurity: Not on file  Transportation Needs: Not on file  Physical Activity: Not on file  Stress: Not on file  Social Connections: Not on file  Intimate Partner Violence: Not on file    Review of Systems:    Constitutional: No weight loss, fever or chills Skin: No rash  Cardiovascular: No chest pain Respiratory: No SOB Gastrointestinal: See HPI and otherwise negative Genitourinary: No dysuria  Neurological: No headache, dizziness or syncope Musculoskeletal: No new muscle or joint pain Hematologic: No bleeding Psychiatric: No history of depression or anxiety   Physical Exam:  Vital signs: BP 110/62   Pulse 70   Ht 5\' 6"  (1.676 m)   Wt 154 lb (69.9 kg)   BMI 24.86 kg/m    Constitutional:   Pleasant Caucasian female appears to be in NAD, Well developed, Well nourished, alert and cooperative Head:  Normocephalic and atraumatic. Eyes:   PEERL, EOMI. No icterus. Conjunctiva pink. Ears:  Normal auditory acuity. Neck:  Supple Throat: Oral cavity and pharynx without inflammation, swelling or lesion.  Respiratory: Respirations even and unlabored. Lungs clear to auscultation bilaterally.   No wheezes, crackles, or rhonchi.  Cardiovascular: Normal S1, S2. No MRG. Regular rate and rhythm. No peripheral edema, cyanosis or pallor.  Gastrointestinal:  Soft, nondistended, moderate generalized ttp. No rebound or guarding. Normal bowel sounds. No appreciable masses or hepatomegaly. Rectal:  Not  performed.  Msk:  Symmetrical without gross deformities. Without edema, no deformity or joint abnormality.  Neurologic:  Alert and  oriented x4;  grossly normal neurologically.  Skin:   Dry and intact without significant lesions or rashes. Psychiatric: Demonstrates good judgement and reason without abnormal affect or behaviors.  RELEVANT LABS AND IMAGING: CBC    Component Value Date/Time   WBC 9.3 03/05/2022 2153   RBC 4.56 03/05/2022 2153   HGB 14.1 03/05/2022 2153   HCT 42.2 03/05/2022 2153   PLT 263 03/05/2022 2153   MCV 92.5 03/05/2022 2153   MCH 30.9 03/05/2022 2153   MCHC 33.4 03/05/2022 2153   RDW 13.2 03/05/2022 2153   LYMPHSABS 2.4 03/05/2022 2153   MONOABS 1.1 (H) 03/05/2022 2153  EOSABS 0.2 03/05/2022 2153   BASOSABS 0.0 03/05/2022 2153    CMP     Component Value Date/Time   NA 139 03/05/2022 2153   K 3.5 03/05/2022 2153   CL 108 03/05/2022 2153   CO2 20 (L) 03/05/2022 2153   GLUCOSE 107 (H) 03/05/2022 2153   BUN 8 03/05/2022 2153   CREATININE 0.97 03/05/2022 2153   CALCIUM 8.9 03/05/2022 2153   PROT 6.5 03/05/2022 2153   ALBUMIN 3.9 03/05/2022 2153   AST 26 03/05/2022 2153   ALT 19 03/05/2022 2153   ALKPHOS 43 03/05/2022 2153   BILITOT 0.8 03/05/2022 2153   GFRNONAA >60 03/05/2022 2153    Assessment: 1.  Generalized abdominal pain: With a below, concern for IBD 2.  Diarrhea: Chronic for the patient her entire life, no increase in frequency over the past few months, but increase in pain and vomiting as above and below, occasional solid stools; concern for IBD versus IBS versus less likely infectious cause given length of symptoms 3.  Nausea and vomiting: Daily, has led to 100 pound weight loss over the past 8 months, CT with sign of colitis; concern for IBD +/- gastritis versus other 4.  Abnormal CT of the abdomen: Showed colitis, with symptoms above; concern for IBD  Plan: 1.  Concern for inflammatory bowel disease given colitis on recent CT and  symptoms of diarrhea and generalized abdominal pain over the past many years.  Went ahead and scheduled patient for diagnostic EGD and colonoscopy in the [facility] at our next available on her earliest convenience.  These were scheduled with Dr. Orvan Falconer on 06/04/2022.  Did provide the patient a detailed list of risks for the procedures and she agrees to proceed. 2.  For now continue Promethazine 25 mg twice daily, prescribed #60 with 3 refills. 3.  Also prescribed Reglan 10 mg x 2 to be used prior to bowel prep dosing. 4.  Pending EGD and colonoscopy may require further work-up. 5.  Patient will follow in clinic per recommendations after time of procedure.  Hyacinth Meeker, PA-C [facility] 05/28/2022, 10:01 AM  Cc: Shellia Cleverly, [place]

## 2022-05-28 NOTE — Patient Instructions (Signed)
We have sent the following medications to your pharmacy for you to pick up at your convenience: Reglan 10 mg take 1 tablet 30 minutes before each half of colonoscopy prep.  Promethazine 25 mg twice daily.  You have been scheduled for an endoscopy and colonoscopy. Please follow the written instructions given to you at your visit today. Please pick up your prep supplies at the pharmacy within the next 1-3 days. If you use inhalers (even only as needed), please bring them with you on the day of your procedure.  _______________________________________________________  If you are age 70 or older, your body mass index should be between 23-30. Your Body mass index is 24.86 kg/m. If this is out of the aforementioned range listed, please consider follow up with your Primary Care Provider.  If you are age 73 or younger, your body mass index should be between 19-25. Your Body mass index is 24.86 kg/m. If this is out of the aformentioned range listed, please consider follow up with your Primary Care Provider.   ________________________________________________________  The [facility] providers would like to encourage you to use [facility] to communicate with providers for non-urgent requests or questions.  Due to long hold times on the telephone, sending your provider a message by [facility] may be a faster and more efficient way to get a response.  Please allow 48 business hours for a response.  Please remember that this is for non-urgent requests.  _______________________________________________________

## 2022-06-04 ENCOUNTER — Ambulatory Visit: Payer: BC Managed Care – PPO | Admitting: Gastroenterology

## 2022-06-04 ENCOUNTER — Telehealth: Payer: Self-pay | Admitting: *Deleted

## 2022-06-04 ENCOUNTER — Encounter: Payer: Self-pay | Admitting: Gastroenterology

## 2022-06-04 VITALS — BP 126/70 | HR 86 | Temp 98.7°F | Ht 66.0 in | Wt 154.0 lb

## 2022-06-04 DIAGNOSIS — R197 Diarrhea, unspecified: Secondary | ICD-10-CM

## 2022-06-04 DIAGNOSIS — R112 Nausea with vomiting, unspecified: Secondary | ICD-10-CM

## 2022-06-04 MED ORDER — SODIUM CHLORIDE 0.9 % IV SOLN: 500.0000 mL | INTRAVENOUS | Status: AC

## 2022-06-04 NOTE — Progress Notes (Unsigned)
Per pt stools are dark brown liquid.  She had vomiting with 2nd half of prep this morning.  Informed Dr. Daleen Bo and she is concerned that pt would not get a good exam d/t poor prep.  Offered pt to reschedule to next Tuesday.  Sample of sutab given to pt.  Instructions reviewed. Instructed pt to take anti nausea meds prior to prep and to call on call MD/office if she has problems with sutab prep.

## 2022-06-04 NOTE — Telephone Encounter (Signed)
Pt rescheduled for Endocolon on 9/13 23 with Dr. Orvan Falconer due to nausea and vomiting resulting in poor prep. New prep instructions with SUTAB reviewed with patient.

## 2022-06-05 ENCOUNTER — Encounter: Payer: Self-pay | Admitting: Gastroenterology

## 2022-06-11 ENCOUNTER — Encounter: Payer: Self-pay | Admitting: Gastroenterology

## 2022-06-11 ENCOUNTER — Ambulatory Visit (AMBULATORY_SURGERY_CENTER): Payer: BC Managed Care – PPO | Admitting: Gastroenterology

## 2022-06-11 VITALS — BP 93/66 | HR 65 | Temp 98.2°F | Resp 21 | Ht 66.0 in | Wt 154.0 lb

## 2022-06-11 DIAGNOSIS — R933 Abnormal findings on diagnostic imaging of other parts of digestive tract: Secondary | ICD-10-CM | POA: Diagnosis not present

## 2022-06-11 DIAGNOSIS — R634 Abnormal weight loss: Secondary | ICD-10-CM

## 2022-06-11 DIAGNOSIS — K319 Disease of stomach and duodenum, unspecified: Secondary | ICD-10-CM | POA: Diagnosis not present

## 2022-06-11 DIAGNOSIS — K219 Gastro-esophageal reflux disease without esophagitis: Secondary | ICD-10-CM

## 2022-06-11 DIAGNOSIS — R197 Diarrhea, unspecified: Secondary | ICD-10-CM | POA: Diagnosis not present

## 2022-06-11 DIAGNOSIS — R112 Nausea with vomiting, unspecified: Secondary | ICD-10-CM

## 2022-06-11 DIAGNOSIS — K229 Disease of esophagus, unspecified: Secondary | ICD-10-CM | POA: Diagnosis not present

## 2022-06-11 MED ORDER — SODIUM CHLORIDE 0.9 % IV SOLN: 500.0000 mL | INTRAVENOUS | Status: DC

## 2022-06-11 NOTE — Op Note (Signed)
[facility] Patient Name: Makayla Obrien Procedure Date: 06/11/2022 3:04 PM MRN: [id number] Endoscopist: Tressia Danas MD, MD Age: 26 Referring MD:  Date of Birth: Dec 23, 1995 Gender: Female Account #: [id number] Procedure:                Upper GI endoscopy Indications:              Abdominal pain, Diarrhea, Nausea with vomiting,                            Weight loss Medicines:                Monitored Anesthesia Care Procedure:                Pre-Anesthesia Assessment:                           - Prior to the procedure, a History and Physical                            was performed, and patient medications and                            allergies were reviewed. The patient's tolerance of                            previous anesthesia was also reviewed. The risks                            and benefits of the procedure and the sedation                            options and risks were discussed with the patient.                            All questions were answered, and informed consent                            was obtained. Prior Anticoagulants: The patient has                            taken no previous anticoagulant or antiplatelet                            agents. ASA Grade Assessment: II - A patient with                            mild systemic disease. After reviewing the risks                            and benefits, the patient was deemed in                            satisfactory condition to undergo the procedure.  After obtaining informed consent, the endoscope was                            passed under direct vision. Throughout the                            procedure, the patient's blood pressure, pulse, and                            oxygen saturations were monitored continuously. The                            GIF [id number] #[id number] was introduced through the                            mouth, and advanced to the third part  of duodenum.                            The upper GI endoscopy was accomplished without                            difficulty. The patient tolerated the procedure                            well. Scope In: Scope Out: Findings:                 The examined esophagus was normal. Biopsies were                            taken from the mid/proximal and distal esophagus                            with a cold forceps for histology. Estimated blood                            loss was minimal.                           The entire examined stomach was normal. Biopsies                            were taken from the antrum, body, and fundus with a                            cold forceps for histology. Estimated blood loss                            was minimal.                           The examined duodenum was normal. Biopsies were                            taken with a cold forceps for histology.  Estimated                            blood loss was minimal.                           The cardia and gastric fundus were normal on                            retroflexion.                           The exam was otherwise without abnormality. Complications:            No immediate complications. Estimated Blood Loss:     Estimated blood loss was minimal. Impression:               - Normal esophagus. Biopsied.                           - Normal stomach. Biopsied.                           - Normal examined duodenum. Biopsied.                           - No obvious source for symptoms identified on this                            examination. Await biopsy results. Recommendation:           - Patient has a contact number available for                            emergencies. The signs and symptoms of potential                            delayed complications were discussed with the                            patient. Return to normal activities tomorrow.                            Written discharge  instructions were provided to the                            patient.                           - Resume previous diet.                           - Continue present medications.                           - Await pathology results. Tressia Danas MD, MD 06/11/2022 3:41:13 PM This report has been signed electronically.

## 2022-06-11 NOTE — Progress Notes (Signed)
Pt's states no medical or surgical changes since previsit or office visit. 

## 2022-06-11 NOTE — Op Note (Signed)
[facility] Patient Name: Makayla Obrien Procedure Date: 06/11/2022 3:04 PM MRN: [id number] Endoscopist: Tressia Danas MD, MD Age: 26 Referring MD:  Date of Birth: 1995/10/12 Gender: Female Account #: [id number] Procedure:                Colonoscopy Indications:              Abdominal pain, Clinically significant diarrhea of                            unexplained origin, Abnormal CT of the GI tract                            with finndings of colitis, 100 pound weight loss Medicines:                Monitored Anesthesia Care Procedure:                Pre-Anesthesia Assessment:                           - Prior to the procedure, a History and Physical                            was performed, and patient medications and                            allergies were reviewed. The patient's tolerance of                            previous anesthesia was also reviewed. The risks                            and benefits of the procedure and the sedation                            options and risks were discussed with the patient.                            All questions were answered, and informed consent                            was obtained. Prior Anticoagulants: The patient has                            taken no previous anticoagulant or antiplatelet                            agents. ASA Grade Assessment: II - A patient with                            mild systemic disease. After reviewing the risks                            and benefits, the patient was deemed in  satisfactory condition to undergo the procedure.                           After obtaining informed consent, the colonoscope                            was passed under direct vision. Throughout the                            procedure, the patient's blood pressure, pulse, and                            oxygen saturations were monitored continuously. The                             Olympus CF-HQ190L [id number]) Colonoscope was                            introduced through the anus and advanced to the 8                            cm into the ileum. The colonoscopy was performed                            without difficulty. The patient tolerated the                            procedure well. The quality of the bowel                            preparation was good. The terminal ileum, ileocecal                            valve, appendiceal orifice, and rectum were                            photographed. Scope In: 3:20:30 PM Scope Out: 3:31:26 PM Scope Withdrawal Time: 0 hours 8 minutes 28 seconds  Total Procedure Duration: 0 hours 10 minutes 56 seconds  Findings:                 The perianal and digital rectal examinations were                            normal.                           The entire examined colonic mucosa appeared normal.                            Biopsies were taken with a cold forceps for                            histology. Estimated blood loss was minimal.  The terminal ileum appeared normal. Biopsies were                            taken with a cold forceps for histology. Estimated                            blood loss was minimal.                           The exam was otherwise without abnormality on                            direct and retroflexion views. Complications:            No immediate complications. Estimated Blood Loss:     Estimated blood loss was minimal. Impression:               - The entire examined colon is normal. Biopsied.                           - The examined portion of the ileum was normal.                            Biopsied.                           - The examination was otherwise normal on direct                            and retroflexion views. No obvious source for                            recent symptoms identified on this exam. Await                             biopsies. Recommendation:           - Patient has a contact number available for                            emergencies. The signs and symptoms of potential                            delayed complications were discussed with the                            patient. Return to normal activities tomorrow.                            Written discharge instructions were provided to the                            patient.                           - Resume previous diet.                           -  Continue present medications.                           - Await pathology results.                           - Office follow-up recommended. Tressia Danas MD, MD 06/11/2022 3:44:59 PM This report has been signed electronically.

## 2022-06-11 NOTE — Progress Notes (Signed)
Called to room to assist during endoscopic procedure.  Patient ID and intended procedure confirmed with present staff. Received instructions for my participation in the procedure from the performing physician.  

## 2022-06-11 NOTE — Patient Instructions (Signed)
Resume previous diet.  Continue present medications.  Await pathology results.  Office follow-up recommended.  YOU HAD AN ENDOSCOPIC PROCEDURE TODAY AT THE [facility]:   Refer to the procedure report that was given to you for any specific questions about what was found during the examination.  If the procedure report does not answer your questions, please call your gastroenterologist to clarify.  If you requested that your care partner not be given the details of your procedure findings, then the procedure report has been included in a sealed envelope for you to review at your convenience later.  YOU SHOULD EXPECT: Some feelings of bloating in the abdomen. Passage of more gas than usual.  Walking can help get rid of the air that was put into your GI tract during the procedure and reduce the bloating. If you had a lower endoscopy (such as a colonoscopy or flexible sigmoidoscopy) you may notice spotting of blood in your stool or on the toilet paper. If you underwent a bowel prep for your procedure, you may not have a normal bowel movement for a few days.  Please Note:  You might notice some irritation and congestion in your nose or some drainage.  This is from the oxygen used during your procedure.  There is no need for concern and it should clear up in a day or so.  SYMPTOMS TO REPORT IMMEDIATELY:  Following lower endoscopy (colonoscopy or flexible sigmoidoscopy):  Excessive amounts of blood in the stool  Significant tenderness or worsening of abdominal pains  Swelling of the abdomen that is new, acute  Fever of 100F or higher  Following upper endoscopy (EGD)  Vomiting of blood or coffee ground material  New chest pain or pain under the shoulder blades  Painful or persistently difficult swallowing  New shortness of breath  Fever of 100F or higher  Black, tarry-looking stools  For urgent or emergent issues, a gastroenterologist can be reached at any hour by calling [phone]  [phone]. Do not use MyChart messaging for urgent concerns.    DIET:  We do recommend a small meal at first, but then you may proceed to your regular diet.  Drink plenty of fluids but you should avoid alcoholic beverages for 24 hours.  ACTIVITY:  You should plan to take it easy for the rest of today and you should NOT DRIVE or use heavy machinery until tomorrow (because of the sedation medicines used during the test).    FOLLOW UP: Our staff will call the number listed on your records the next business day following your procedure.  We will call around 7:15- 8:00 am to check on you and address any questions or concerns that you may have regarding the information given to you following your procedure. If we do not reach you, we will leave a message.     If any biopsies were taken you will be contacted by phone or by letter within the next 1-3 weeks.  Please call us at [phone] if you have not heard about the biopsies in 3 weeks.    SIGNATURES/CONFIDENTIALITY: You and/or your care partner have signed paperwork which will be entered into your electronic medical record.  These signatures attest to the fact that that the information above on your After Visit Summary has been reviewed and is understood.  Full responsibility of the confidentiality of this discharge information lies with you and/or your care-partner.

## 2022-06-11 NOTE — Progress Notes (Signed)
Report to PACU, RN, vss, BBS= Clear.  

## 2022-06-11 NOTE — Progress Notes (Signed)
Indications for EGD: Abdominal pain, nausea, vomiting, 100 pound weight loss Indications for colonoscopy, abdominal pain, diarrhea, abnormal CT of the abdomen showing colitis, 100 pound weight loss  Please see the 05/28/2022 office visit for complete details.  There is been no significant change in history or physical exam since that time.  The patient remains an appropriate candidate for monitored anesthesia care in the endoscopy center today.`

## 2022-06-12 ENCOUNTER — Telehealth: Payer: Self-pay

## 2022-06-12 ENCOUNTER — Telehealth: Payer: Self-pay | Admitting: *Deleted

## 2022-06-12 NOTE — Telephone Encounter (Signed)
-----   Message from Tressia Danas, MD sent at 06/11/2022  3:41 PM EDT ----- Please arrange office follow-up with Makayla Obrien. Next available. Thanks.  KLB

## 2022-06-12 NOTE — Telephone Encounter (Signed)
  Follow up Call-     06/11/2022    2:30 PM 06/04/2022    9:39 AM  Call back number  Post procedure Call Back phone  # [phone]  Permission to leave phone message Yes Yes     Patient questions:  Do you have a fever, pain , or abdominal swelling? No. Pain Score  0 *  Have you tolerated food without any problems? Yes.    Have you been able to return to your normal activities? Yes.    Do you have any questions about your discharge instructions: Diet   No. Medications  No. Follow up visit  No.  Do you have questions or concerns about your Care? No.  Actions: * If pain score is 4 or above: No action needed, pain <4.

## 2022-06-12 NOTE — Telephone Encounter (Signed)
Spoke with patient & she has been scheduled for f/u with Victorino Dike, [place] on 07/15/22 at 8:30 am.

## 2022-06-16 ENCOUNTER — Encounter: Payer: Self-pay | Admitting: Gastroenterology

## 2022-06-17 ENCOUNTER — Other Ambulatory Visit: Payer: Self-pay

## 2022-06-17 MED ORDER — PANTOPRAZOLE SODIUM 40 MG PO TBEC: 40.0000 mg | DELAYED_RELEASE_TABLET | Freq: Two times a day (BID) | ORAL | 1 refills | Status: DC

## 2022-07-15 ENCOUNTER — Ambulatory Visit: Payer: BC Managed Care – PPO | Admitting: Physician Assistant

## 2022-07-17 ENCOUNTER — Emergency Department (HOSPITAL_COMMUNITY): Payer: BC Managed Care – PPO

## 2022-07-17 ENCOUNTER — Other Ambulatory Visit: Payer: Self-pay

## 2022-07-17 ENCOUNTER — Encounter (HOSPITAL_COMMUNITY): Payer: Self-pay

## 2022-07-17 ENCOUNTER — Emergency Department (HOSPITAL_COMMUNITY)
Admission: EM | Admit: 2022-07-17 | Discharge: 2022-07-17 | Disposition: A | Discharge status: Home / Self Care | Payer: BC Managed Care – PPO | Attending: Emergency Medicine | Admitting: Emergency Medicine

## 2022-07-17 DIAGNOSIS — R109 Unspecified abdominal pain: Secondary | ICD-10-CM | POA: Diagnosis present

## 2022-07-17 DIAGNOSIS — N9489 Other specified conditions associated with female genital organs and menstrual cycle: Secondary | ICD-10-CM | POA: Diagnosis not present

## 2022-07-17 DIAGNOSIS — N12 Tubulo-interstitial nephritis, not specified as acute or chronic: Secondary | ICD-10-CM | POA: Diagnosis not present

## 2022-07-17 LAB — COMPREHENSIVE METABOLIC PANEL
ALT: 14 U/L (ref 0–44)
AST: 20 U/L (ref 15–41)
Albumin: 4.5 g/dL (ref 3.5–5.0)
Alkaline Phosphatase: 53 U/L (ref 38–126)
Anion gap: 12 (ref 5–15)
BUN: 11 mg/dL (ref 6–20)
CO2: 23 mmol/L (ref 22–32)
Calcium: 9.7 mg/dL (ref 8.9–10.3)
Chloride: 104 mmol/L (ref 98–111)
Creatinine, Ser: 1.04 mg/dL — ABNORMAL HIGH (ref 0.44–1.00)
GFR, Estimated: >60 mL/min (ref >60)
Glucose, Bld: 103 mg/dL — ABNORMAL HIGH (ref 70–99)
Potassium: 4.3 mmol/L (ref 3.5–5.1)
Sodium: 139 mmol/L (ref 135–145)
Total Bilirubin: 1.1 mg/dL (ref 0.3–1.2)
Total Protein: 7.6 g/dL (ref 6.5–8.1)

## 2022-07-17 LAB — LIPASE, BLOOD: Lipase: 33 U/L (ref 11–51)

## 2022-07-17 LAB — URINALYSIS, ROUTINE W REFLEX MICROSCOPIC
Bilirubin Urine: NEGATIVE
Glucose, UA: NEGATIVE mg/dL
Ketones, ur: 20 mg/dL — AB
Nitrite: POSITIVE — AB
Protein, ur: 100 mg/dL — AB
Specific Gravity, Urine: 1.017 (ref 1.005–1.030)
WBC, UA: >50 WBC/hpf — ABNORMAL HIGH (ref 0–5)
pH: 5 (ref 5.0–8.0)

## 2022-07-17 LAB — CBC WITH DIFFERENTIAL/PLATELET
Abs Immature Granulocytes: 0.09 10*3/uL — ABNORMAL HIGH (ref 0.00–0.07)
Basophils Absolute: 0.1 10*3/uL (ref 0.0–0.1)
Basophils Relative: 0 %
Eosinophils Absolute: 0 10*3/uL (ref 0.0–0.5)
Eosinophils Relative: 0 %
HCT: 44.1 % (ref 36.0–46.0)
Hemoglobin: 14.7 g/dL (ref 12.0–15.0)
Immature Granulocytes: 1 %
Lymphocytes Relative: 9 %
Lymphs Abs: 1.7 10*3/uL (ref 0.7–4.0)
MCH: 30.9 pg (ref 26.0–34.0)
MCHC: 33.3 g/dL (ref 30.0–36.0)
MCV: 92.6 fL (ref 80.0–100.0)
Monocytes Absolute: 1.8 10*3/uL — ABNORMAL HIGH (ref 0.1–1.0)
Monocytes Relative: 9 %
Neutro Abs: 15.9 10*3/uL — ABNORMAL HIGH (ref 1.7–7.7)
Neutrophils Relative %: 81 %
Platelets: 300 10*3/uL (ref 150–400)
RBC: 4.76 MIL/uL (ref 3.87–5.11)
RDW: 12.6 % (ref 11.5–15.5)
WBC: 19.5 10*3/uL — ABNORMAL HIGH (ref 4.0–10.5)
nRBC: 0 % (ref 0.0–0.2)

## 2022-07-17 LAB — I-STAT BETA HCG BLOOD, ED (MC, WL, AP ONLY): I-stat hCG, quantitative: <5 m[IU]/mL (ref <5)

## 2022-07-17 MED ORDER — SODIUM CHLORIDE 0.9 % IV BOLUS
1000.0000 mL | Freq: Once | INTRAVENOUS | Status: AC
  Administered 2022-07-17: 1000 mL via INTRAVENOUS

## 2022-07-17 MED ORDER — METOCLOPRAMIDE HCL 5 MG/ML IJ SOLN
10.0000 mg | Freq: Once | INTRAMUSCULAR | Status: AC
  Administered 2022-07-17: 10 mg via INTRAVENOUS
  Filled 2022-07-17: qty 2

## 2022-07-17 MED ORDER — HYDROCODONE-ACETAMINOPHEN 5-325 MG PO TABS: 1.0000 | ORAL_TABLET | ORAL | 0 refills | Status: DC | PRN

## 2022-07-17 MED ORDER — SODIUM CHLORIDE 0.9 % IV SOLN
1.0000 g | Freq: Once | INTRAVENOUS | Status: AC
  Administered 2022-07-17: 1 g via INTRAVENOUS
  Filled 2022-07-17: qty 10

## 2022-07-17 MED ORDER — KETOROLAC TROMETHAMINE 15 MG/ML IJ SOLN
15.0000 mg | Freq: Once | INTRAMUSCULAR | Status: AC
  Administered 2022-07-17: 15 mg via INTRAVENOUS
  Filled 2022-07-17: qty 1

## 2022-07-17 MED ORDER — ONDANSETRON HCL 4 MG PO TABS: 4.0000 mg | ORAL_TABLET | Freq: Four times a day (QID) | ORAL | 0 refills | Status: DC

## 2022-07-17 MED ORDER — OXYCODONE-ACETAMINOPHEN 5-325 MG PO TABS
1.0000 | ORAL_TABLET | ORAL | Status: DC | PRN
  Administered 2022-07-17: 1 via ORAL
  Filled 2022-07-17: qty 1

## 2022-07-17 MED ORDER — HYDROMORPHONE HCL 1 MG/ML IJ SOLN
0.5000 mg | Freq: Once | INTRAMUSCULAR | Status: AC
  Administered 2022-07-17: 0.5 mg via INTRAVENOUS
  Filled 2022-07-17: qty 1

## 2022-07-17 MED ORDER — ONDANSETRON 4 MG PO TBDP
8.0000 mg | ORAL_TABLET | Freq: Once | ORAL | Status: AC
  Administered 2022-07-17: 8 mg via ORAL
  Filled 2022-07-17: qty 2

## 2022-07-17 MED ORDER — HYDROCODONE-ACETAMINOPHEN 5-325 MG PO TABS: 1.0000 | ORAL_TABLET | ORAL | 0 refills | Status: AC | PRN

## 2022-07-17 MED ORDER — CEFPODOXIME PROXETIL 200 MG PO TABS: 200.0000 mg | ORAL_TABLET | Freq: Two times a day (BID) | ORAL | 0 refills | Status: AC

## 2022-07-17 MED ORDER — ONDANSETRON HCL 4 MG PO TABS: 4.0000 mg | ORAL_TABLET | Freq: Four times a day (QID) | ORAL | 0 refills | Status: AC

## 2022-07-17 MED ORDER — CEFPODOXIME PROXETIL 200 MG PO TABS: 200.0000 mg | ORAL_TABLET | Freq: Two times a day (BID) | ORAL | 0 refills | Status: DC

## 2022-07-17 NOTE — ED Provider Triage Note (Addendum)
Emergency Medicine Provider Triage Evaluation Note  Makayla Obrien , a 26 y.o. female  was evaluated in triage.  Pt complains of vomiting and flank pain.  States she has had a year-long history of vomiting.  Yesterday she started to feel pain in her right side.  Denies hematuria or painful urination.  Vomiting is also much worse.  States that she does take delta 8 Gummies.  Had a recent endoscopy and colonoscopy for her ongoing vomiting about 3 weeks ago.  Per mother findings were unremarkable did reveal a concerning spot for cancer cells which was removed.  Patient is sexually active with indwelling IUD.  Denies abnormal vaginal discharge.  History of appendectomy.  Review of Systems  Positive: See above Negative: See above  Physical Exam  BP 129/73 (BP Location: Left Arm)   Pulse (!) 114   Temp 98.5 F (36.9 C) (Oral)   Resp 18   Ht 5\' 6"  (1.676 m)   Wt 69.9 kg   SpO2 100%   BMI 24.86 kg/m  Gen:   Awake, no distress   Resp:  Normal effort  MSK:   Moves extremities without difficulty  Other:  Right-sided CVA tenderness  Medical Decision Making  Medically screening exam initiated at 11:35 AM.  Appropriate orders placed.  Maribeth Jiles was informed that the remainder of the evaluation will be completed by another provider, this initial triage assessment does not replace that evaluation, and the importance of remaining in the ED until their evaluation is complete.   Work-up initiated   Harriet Pho, PA-C 07/17/22 1138    Harriet Pho, PA-C 07/17/22 1140

## 2022-07-17 NOTE — Discharge Instructions (Addendum)
Please follow-up with your primary care doctor, pain should be improving within the next 48 hours, if you develop fever, chills, worsening pain, intractable nausea or vomiting please return to the ER.  You were given 3 sets of medications in order to help control your symptoms.  The first medication is an antibiotic which she will need to take today for the next 7 days.  The second medication is medication to help for pain control, please take this only for severe pain.  The third medication is Zofran, this should help with any nausea.  Please continue to take this as needed.

## 2022-07-17 NOTE — ED Notes (Signed)
Pt given ice per [place], [place].

## 2022-07-17 NOTE — ED Provider Notes (Addendum)
[facility] EMERGENCY DEPARTMENT Provider Note   CSN: [id number] Arrival date & time: 07/17/22  1025     History  No chief complaint on file.   Makayla Obrien is a 26 y.o. female, who presents to the ED 2/2 to right flank pain for the last 3 days. Reports worsening pain, denies dysuria, increased urinary frequency, vaginal discharge. Endorses frequent nausea and vomiting. Has been unable to tolerate PO intake. Denies any sick contacts. Is sexually active, but monogamous. Denies concerns for STDs.      Home Medications Prior to Admission medications   Medication Sig Start Date End Date Taking? Authorizing Provider  albuterol (VENTOLIN HFA) 108 (90 Base) MCG/ACT inhaler Inhale into the lungs. 11/28/16   [provider]  pantoprazole (PROTONIX) 40 MG tablet Take 1 tablet (40 mg total) by mouth 2 (two) times daily. 06/17/22 07/17/22  Tressia Danas, MD  promethazine (PHENERGAN) 25 MG tablet Take 1 tablet (25 mg total) by mouth 2 (two) times daily. 05/28/22   Unk Lightning, PA      Allergies    Patient has no known allergies.    Review of Systems   Review of Systems  Gastrointestinal:  Positive for nausea and vomiting. Negative for abdominal pain, constipation and diarrhea.  Genitourinary:  Positive for flank pain. Negative for dysuria, frequency and vaginal discharge.    Physical Exam Updated Vital Signs BP 129/71   Pulse 88   Temp 98.5 F (36.9 C) (Oral)   Resp 16   Ht 5\' 6"  (1.676 m)   Wt 69.9 kg   SpO2 99%   BMI 24.86 kg/m  Physical Exam Constitutional:      Appearance: She is ill-appearing.  HENT:     Head: Normocephalic.     Nose: Nose normal.     Mouth/Throat:     Mouth: Mucous membranes are dry.  Eyes:     Conjunctiva/sclera: Conjunctivae normal.  Cardiovascular:     Rate and Rhythm: Normal rate and regular rhythm.  Pulmonary:     Effort: Pulmonary effort is normal.  Abdominal:     General: Abdomen is flat.      Tenderness: There is no abdominal tenderness. There is right CVA tenderness. There is no guarding.  Neurological:     Mental Status: She is alert.     ED Results / Procedures / Treatments   Labs (all labs ordered are listed, but only abnormal results are displayed) Labs Reviewed  COMPREHENSIVE METABOLIC PANEL - Abnormal; Notable for the following components:      Result Value   Glucose, Bld 103 (*)    Creatinine, Ser 1.04 (*)    All other components within normal limits  CBC WITH DIFFERENTIAL/PLATELET - Abnormal; Notable for the following components:   WBC 19.5 (*)    Neutro Abs 15.9 (*)    Monocytes Absolute 1.8 (*)    Abs Immature Granulocytes 0.09 (*)    All other components within normal limits  URINALYSIS, ROUTINE W REFLEX MICROSCOPIC - Abnormal; Notable for the following components:   APPearance CLOUDY (*)    Hgb urine dipstick Makayla Obrien (*)    Ketones, ur 20 (*)    Protein, ur 100 (*)    Nitrite POSITIVE (*)    Leukocytes,Ua MODERATE (*)    WBC, UA >50 (*)    Bacteria, UA MANY (*)    All other components within normal limits  LIPASE, BLOOD  I-STAT BETA HCG BLOOD, ED ([facility], [facility], [facility] ONLY)  EKG None  Radiology No results found.  Procedures Procedures   Medications Ordered in ED Medications  oxyCODONE-acetaminophen (PERCOCET/ROXICET) 5-325 MG per tablet 1 tablet (1 tablet Oral Given 07/17/22 1150)  ondansetron (ZOFRAN-ODT) disintegrating tablet 8 mg (8 mg Oral Given 07/17/22 1146)  HYDROmorphone (DILAUDID) injection 0.5 mg (0.5 mg Intravenous Given 07/17/22 1433)  cefTRIAXone (ROCEPHIN) 1 g in sodium chloride 0.9 % 100 mL IVPB (0 g Intravenous Stopped 07/17/22 1515)  sodium chloride 0.9 % bolus 1,000 mL (1,000 mLs Intravenous New Bag/Given 07/17/22 1433)    ED Course/ Medical Decision Making/ A&P Clinical Course as of 07/17/22 1521  Wed Jul 17, 2022  1517 WBC, UA(!): >50 [JS]  1518 Nitrite(!): POSITIVE [JS]  1518 Bacteria, UA(!): MANY [JS]    Clinical Course  User Index [JS] Janeece Fitting, PA-C                           Medical Decision Making Patient is a 26 year old female, here for right flank pain, and denies urinary symptoms, denies concern for STD, admits monogamous relationship.  Labs and UA obtained.  Given Percocet in triage  Amount and/or Complexity of Data Reviewed Labs: ordered. Decision-making details documented in ED Course.    Details: Labs significant for leukocytosis of 19k, UA concerning for UTI given nitrate positive, many bacteria and leukocytes Discussion of management or test interpretation with external provider(s): Patient is a 26 y.o. female, here for flank pain, denies fever, endorses chills. Appears in pain, but not toxic appearing. Given zofran, dilaudid, ceftriaxone, and IVF. Pain greatly improved. Pending PO challenge. Findings concerning for pyelonephritis, discussed with patient, will likely DC with Cefpodoxime, Zofran, and toradol pending successful PO challenge.   Transferred care to [facility].    Final Clinical Impression(s) / ED Diagnoses Final diagnoses:  Pyelonephritis    Rx / DC Orders ED Discharge Orders     None         Jayvien Rowlette, Si Gaul, PA 07/17/22 1521    Ah Bott, [place], [place] 07/17/22 1521    Sherwood Gambler, MD 07/22/22 1747

## 2022-07-17 NOTE — ED Provider Notes (Signed)
  Physical Exam  BP 117/68   Pulse 86   Temp 98.5 F (36.9 C) (Oral)   Resp 16   Ht 5\' 6"  (1.676 m)   Wt 69.9 kg   SpO2 100%   BMI 24.86 kg/m   Physical Exam Vitals and nursing note reviewed.  Constitutional:      Appearance: Normal appearance.  HENT:     Head: Normocephalic and atraumatic.     Mouth/Throat:     Mouth: Mucous membranes are moist.  Eyes:     Pupils: Pupils are equal, round, and reactive to light.  Cardiovascular:     Rate and Rhythm: Normal rate.  Pulmonary:     Effort: Pulmonary effort is normal.  Abdominal:     General: Abdomen is flat.     Tenderness: There is right CVA tenderness.  Musculoskeletal:     Cervical back: Normal range of motion and neck supple.  Skin:    General: Skin is warm and dry.  Neurological:     Mental Status: She is alert and oriented to person, place, and time.     Procedures  Procedures  ED Course / MDM   Clinical Course as of 07/17/22 1911  Wed Jul 17, 2022  1517 WBC, UA(!): >50 [JS]  1518 Nitrite(!): POSITIVE [JS]  1518 Bacteria, UA(!): MANY [JS]    Clinical Course User Index [JS] Janeece Fitting, PA-C   Medical Decision Making Amount and/or Complexity of Data Reviewed Labs:  Decision-making details documented in ED Course. Radiology: ordered.  Risk Prescription drug management.   Patient care assumed from [place]. PA at shift change, please see her note for a full HPI. Briefly, patient here with right flank pain x 3 days, suspected to be pyelonephritis. UA concerning for infection. She is receiving Ceftriaxone, along with fluids will need to finish fluids along with PO trial and home with abx prescription.   5:19 PM patient continued to have pain, she did not have any vomiting.  She was p.o. trial and is tolerating p.o. adequately, does complain of some residual pain.  She is afebrile although I am somewhat suspected of a infected stone, I discussed this case with Dr. Sharlett Iles and will obtain a CT renal to  rule out stone at this time.  Patient and mother at the bedside are agreeable with plan and treatment.  7:11 PM CT findings are negative for any ureteral stone.  I discussed these results with patient.  She will go home with a short course of antibiotics, Zofran, Norco for pain control.  These medications were written by me.  Patient is hemodynamically stable for discharge completed p.o. trial.    Portions of this note were generated with Dragon dictation software. Dictation errors may occur despite best attempts at proofreading.         Janeece Fitting, PA-C 07/17/22 1911    Fransico Meadow, MD 07/18/22 [phone]

## 2022-07-17 NOTE — ED Triage Notes (Signed)
Persistent n/v had endo/colonscopy for it which was normal.  Complains of worsening vomiting and right flank

## 2022-08-11 NOTE — Progress Notes (Unsigned)
08/15/2022 Makayla Obrien [id number] Jul 30, 1996  Referring provider: Shellia Cleverly, PA Primary GI doctor: Dr. Orvan Falconer  ASSESSMENT AND PLAN:   Irritable bowel syndrome with diarrhea Unremarkable colon/EGD Likely IBS-D, controlling with diet at this time On Abx at this time for dog bites, get on probiotic for 1-2 months IBGARD daily, Add on citracel/benefiber FODMAP and  lifestyle changes discussed Consider SIBO testing or xifaxin trial if not improving but she feels she is controlling her symptoms with diet at this time.   Nausea and vomiting, unspecified vomiting type AB exam normal CT unremarkable Instructed to quit smoking marijuana, can take up to 9-12 months of cessation.  Has stopped   Patient Care Team: Shellia Cleverly, PA as PCP - General (General Practice)  HISTORY OF PRESENT ILLNESS: 26 y.o. female with a past medical history of asthma and others listed below presents for evaluation of abdominal pain and nausea.   6 01/2022 CT abdomen pelvis with contrast in the ER for nausea vomiting diarrhea showed mild a sending and transverse colonic wall thickening with some adjacent stranding which may reflect mild colitis and patchy nodular groundglass opacities in the left lung base which were likely infectious or inflammatory.  At that time patient given Cipro and Flagyl as well as Zofran.   05/28/2022 office visit with PA Hyacinth Meeker schedule for EGD colonoscopy at [facility] 06/11/2022 colonoscopy endoscopy  colonoscopy showed normal exam Endoscopy showed normal esophagus, normal stomach, normal duodenum Pathology showed normal TI, negative microscopic colitis, no inflammation.   EGD negative for H. pylori, EOE, celiac.  Showed reflux esophagitis.   Started on pantoprazole 40 mg twice daily for 8 weeks. Presents for follow up.  She stopped her pantoprazole due to worsening loose stools and caused worse nausea. Changed her diet, so lactose free and she states it has  improved.  She is using the nausea medication less.  Can have nausea in the morning. No GERD.  She does not drink ETOH, have light dinner, no smoking marijuana.  BM every day or every other day, complete BM, formed stools occ loose stools.  Occ middle/lower AB pain. Some bloating,   She  reports that she has never smoked. She has never used smokeless tobacco. She reports current alcohol use. She reports current drug use. Drug: Marijuana.  Current Medications:       Current Outpatient Medications (Respiratory):    albuterol (VENTOLIN HFA) 108 (90 Base) MCG/ACT inhaler, Inhale into the lungs.   promethazine (PHENERGAN) 25 MG tablet, Take 1 tablet (25 mg total) by mouth 2 (two) times daily.   Current Outpatient Medications (Analgesics):    HYDROcodone-acetaminophen (NORCO) 10-325 MG tablet, Take 1 tablet by mouth every 6 (six) hours as needed.     Current Outpatient Medications (Other):    amoxicillin (AMOXIL) 125 MG/5ML suspension, Take 125 mg by mouth 3 (three) times daily.  Current Facility-Administered Medications (Other):    0.9 %  sodium chloride infusion  Medical History:  Past Medical History:  Diagnosis Date   Allergy    seasonal   Anxiety    Asthma    Allergies: No Known Allergies   Surgical History:  She  has a past surgical history that includes Appendectomy (2015). Family History:  Her family history includes Cervical cancer in her mother; Lung cancer in her maternal grandfather.  REVIEW OF SYSTEMS  : All other systems reviewed and negative except where noted in the History of Present Illness.  PHYSICAL EXAM: BP 122/64  Pulse 85   Ht 5\' 6"  (1.676 m)   Wt 149 lb 1 oz (67.6 kg)   BMI 24.06 kg/m  General:   Pleasant, well developed female in no acute distress Head:   Normocephalic and atraumatic. Eyes:  sclerae anicteric,conjunctive pink  Heart:   regular rate and rhythm Pulm:  Clear anteriorly; no wheezing Abdomen:   Soft, Non-distended AB, Active  bowel sounds. No tenderness . Without guarding and Without rebound, No organomegaly appreciated. Rectal: Not evaluated Extremities:  Without edema. Bilateral hands wrapped from recent dog bites.  Msk: Symmetrical without gross deformities. Peripheral pulses intact.  Neurologic:  Alert and  oriented x4;  No focal deficits.  Skin:   Dry and intact without significant lesions or rashes. Psychiatric:  Cooperative. Normal mood and affect.    , PA-C 9:35 AM

## 2022-08-15 ENCOUNTER — Ambulatory Visit (INDEPENDENT_AMBULATORY_CARE_PROVIDER_SITE_OTHER): Payer: BC Managed Care – PPO | Admitting: Physician Assistant

## 2022-08-15 ENCOUNTER — Encounter: Payer: Self-pay | Admitting: Physician Assistant

## 2022-08-15 VITALS — BP 122/64 | HR 85 | Ht 66.0 in | Wt 149.1 lb

## 2022-08-15 DIAGNOSIS — R112 Nausea with vomiting, unspecified: Secondary | ICD-10-CM | POA: Diagnosis not present

## 2022-08-15 DIAGNOSIS — K580 Irritable bowel syndrome with diarrhea: Secondary | ICD-10-CM | POA: Diagnosis not present

## 2022-08-15 NOTE — Patient Instructions (Addendum)
First do a trial off milk/lactose products if you use them.  Add fiber like benefiber or citracel once a day Increase activity Can do probiotic for 1-2 months after finish antibiotics, like align or florastor Can do trial of IBGard which is over the counter for AB pain- Take 1-2 capsules once a day for maintence or twice a day during a flare Please try to decrease stress. consider talking with PCP about anti anxiety medication or try head space app for meditation. if any worsening symptoms like blood in stool, weight loss, please call the office   We may want to evaluate you for small intestinal bacterial overgrowth, this can cause increase gas, bloating, loose stools or constipation.  There is a test for this we can do or sometimes we will treat a patient with an antibiotic to see if it helps.     Please try low FODMAP diet- see below- start with eliminating just one column at a time, the table at the very bottom contains foods that are safe to take   FODMAP stands for fermentable oligo-, di-, mono-saccharides and polyols (1). These are the scientific terms used to classify groups of carbs that are notorious for triggering digestive symptoms like bloating, gas and stomach pain.

## 2022-09-26 NOTE — Progress Notes (Signed)
Reviewed and agree with management plans. ? ?Canaan Holzer L. Katana Berthold, MD, MPH  ?

## 2023-01-27 IMAGING — DX DG CHEST 2V
2 series · 2 of 2 positions shown · non-contrast
Comparison: Chest radiograph dated October 14, 2014

CLINICAL DATA: cough

EXAM:
CHEST - 2 VIEW

[chest pa]
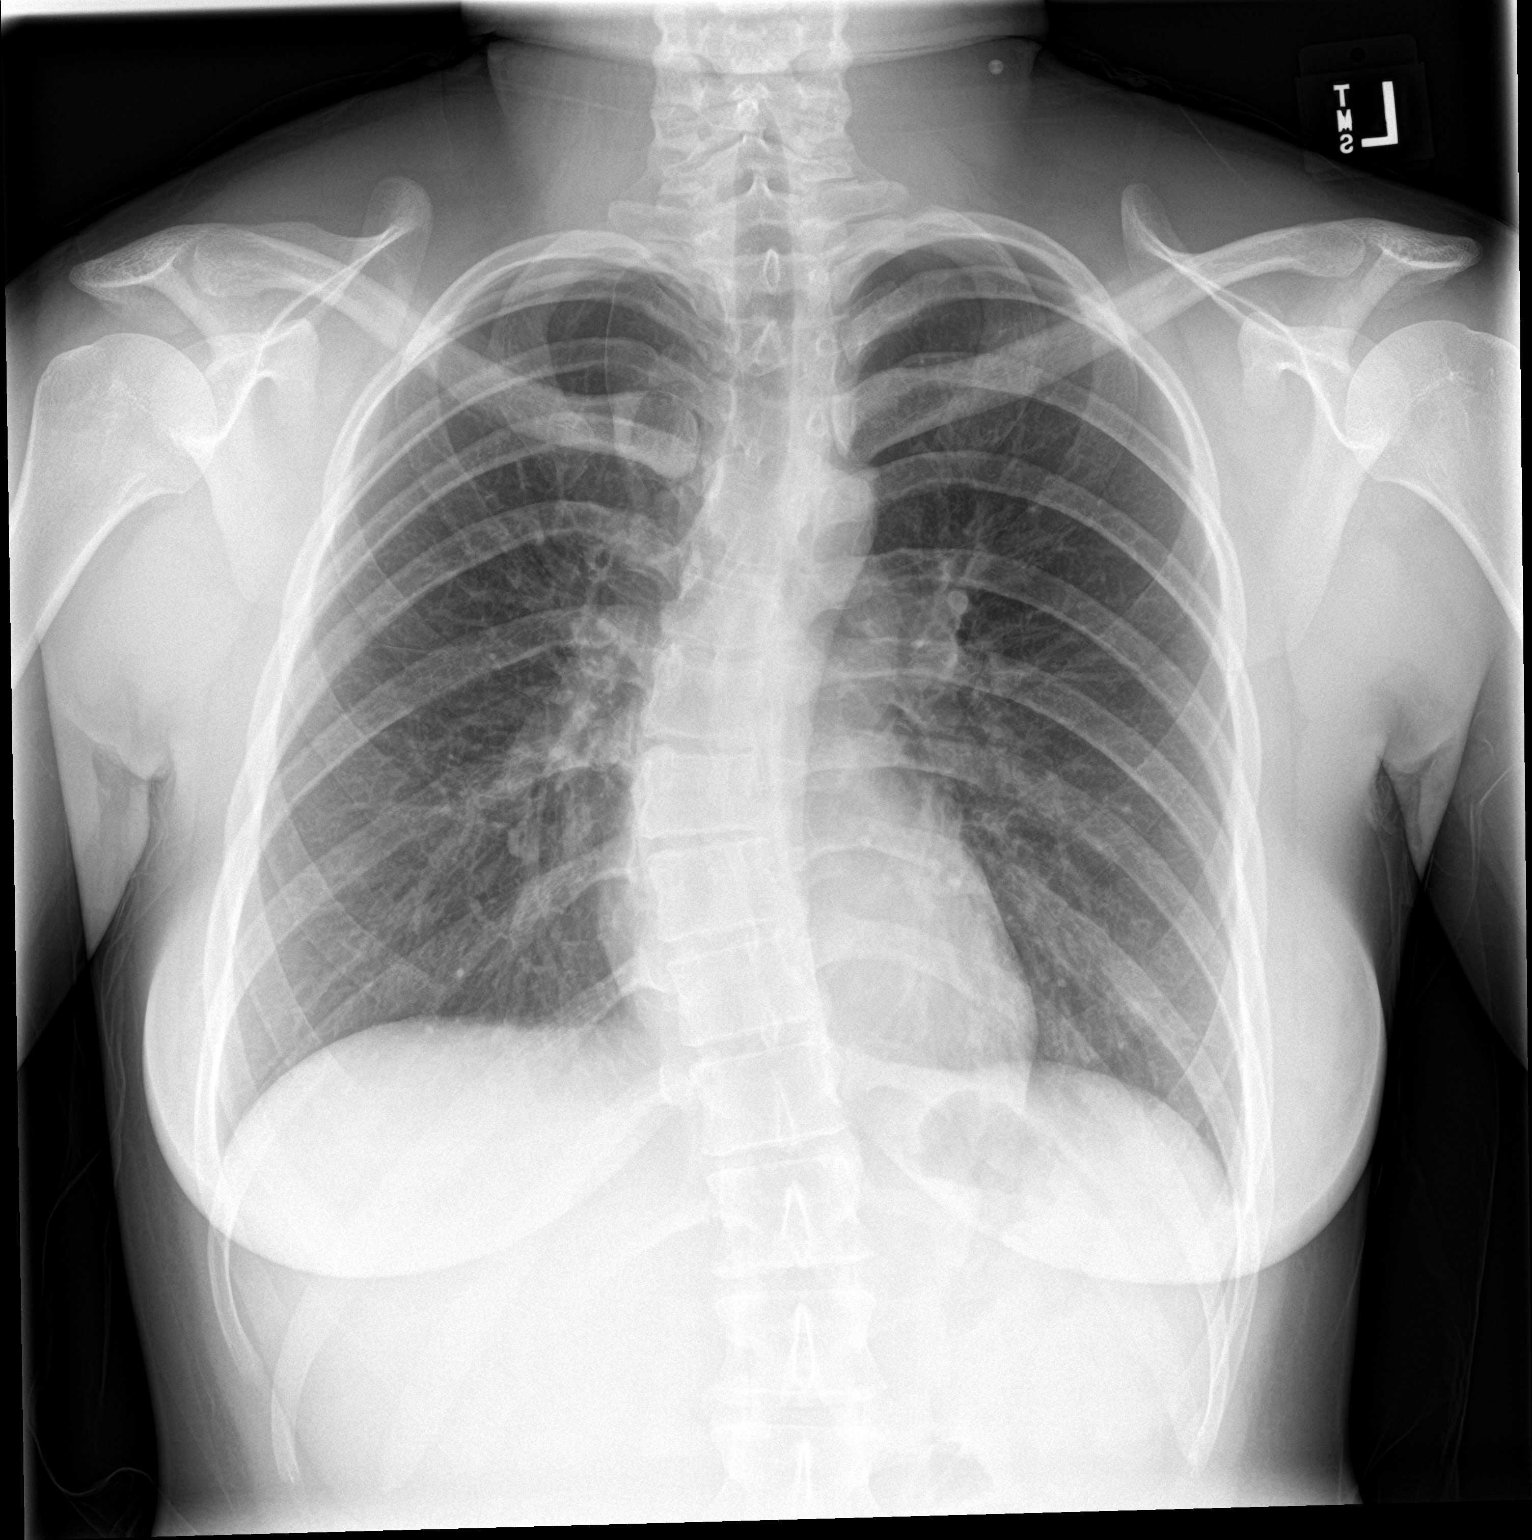

[chest lat]
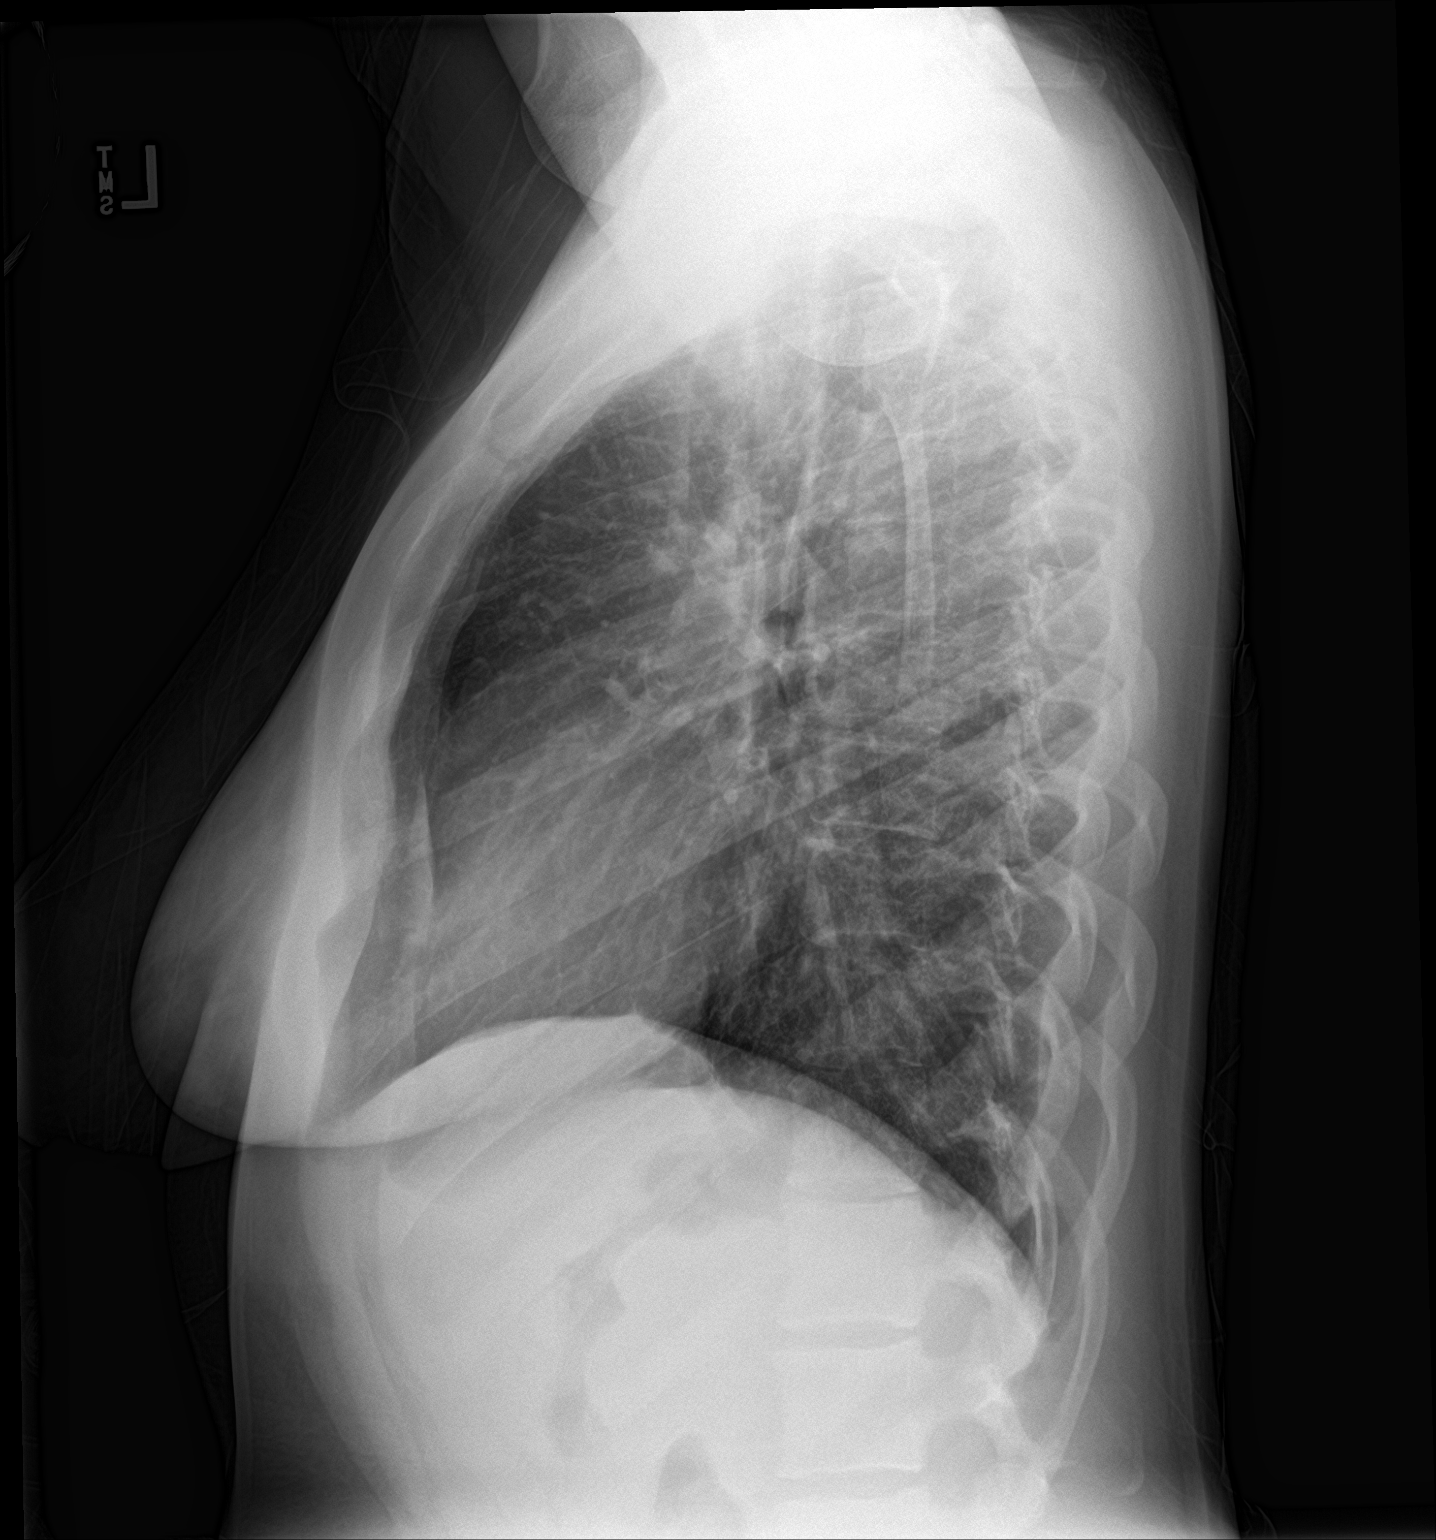

[2 of 2 positions shown; findings below may reference images not displayed]

FINDINGS: The cardiomediastinal silhouette is normal in contour. No pleural
effusion. No pneumothorax. No acute pleuroparenchymal abnormality.
Visualized abdomen is unremarkable. Dextroscoliosis of the thoracic
spine.
IMPRESSION: No acute cardiopulmonary abnormality.

## 2023-01-28 IMAGING — CR DG CHEST 2V
2 series · 2 of 2 positions shown · non-contrast
Comparison: 03/04/2022.

CLINICAL DATA: Cough.

EXAM:
CHEST - 2 VIEW

[chest pa]
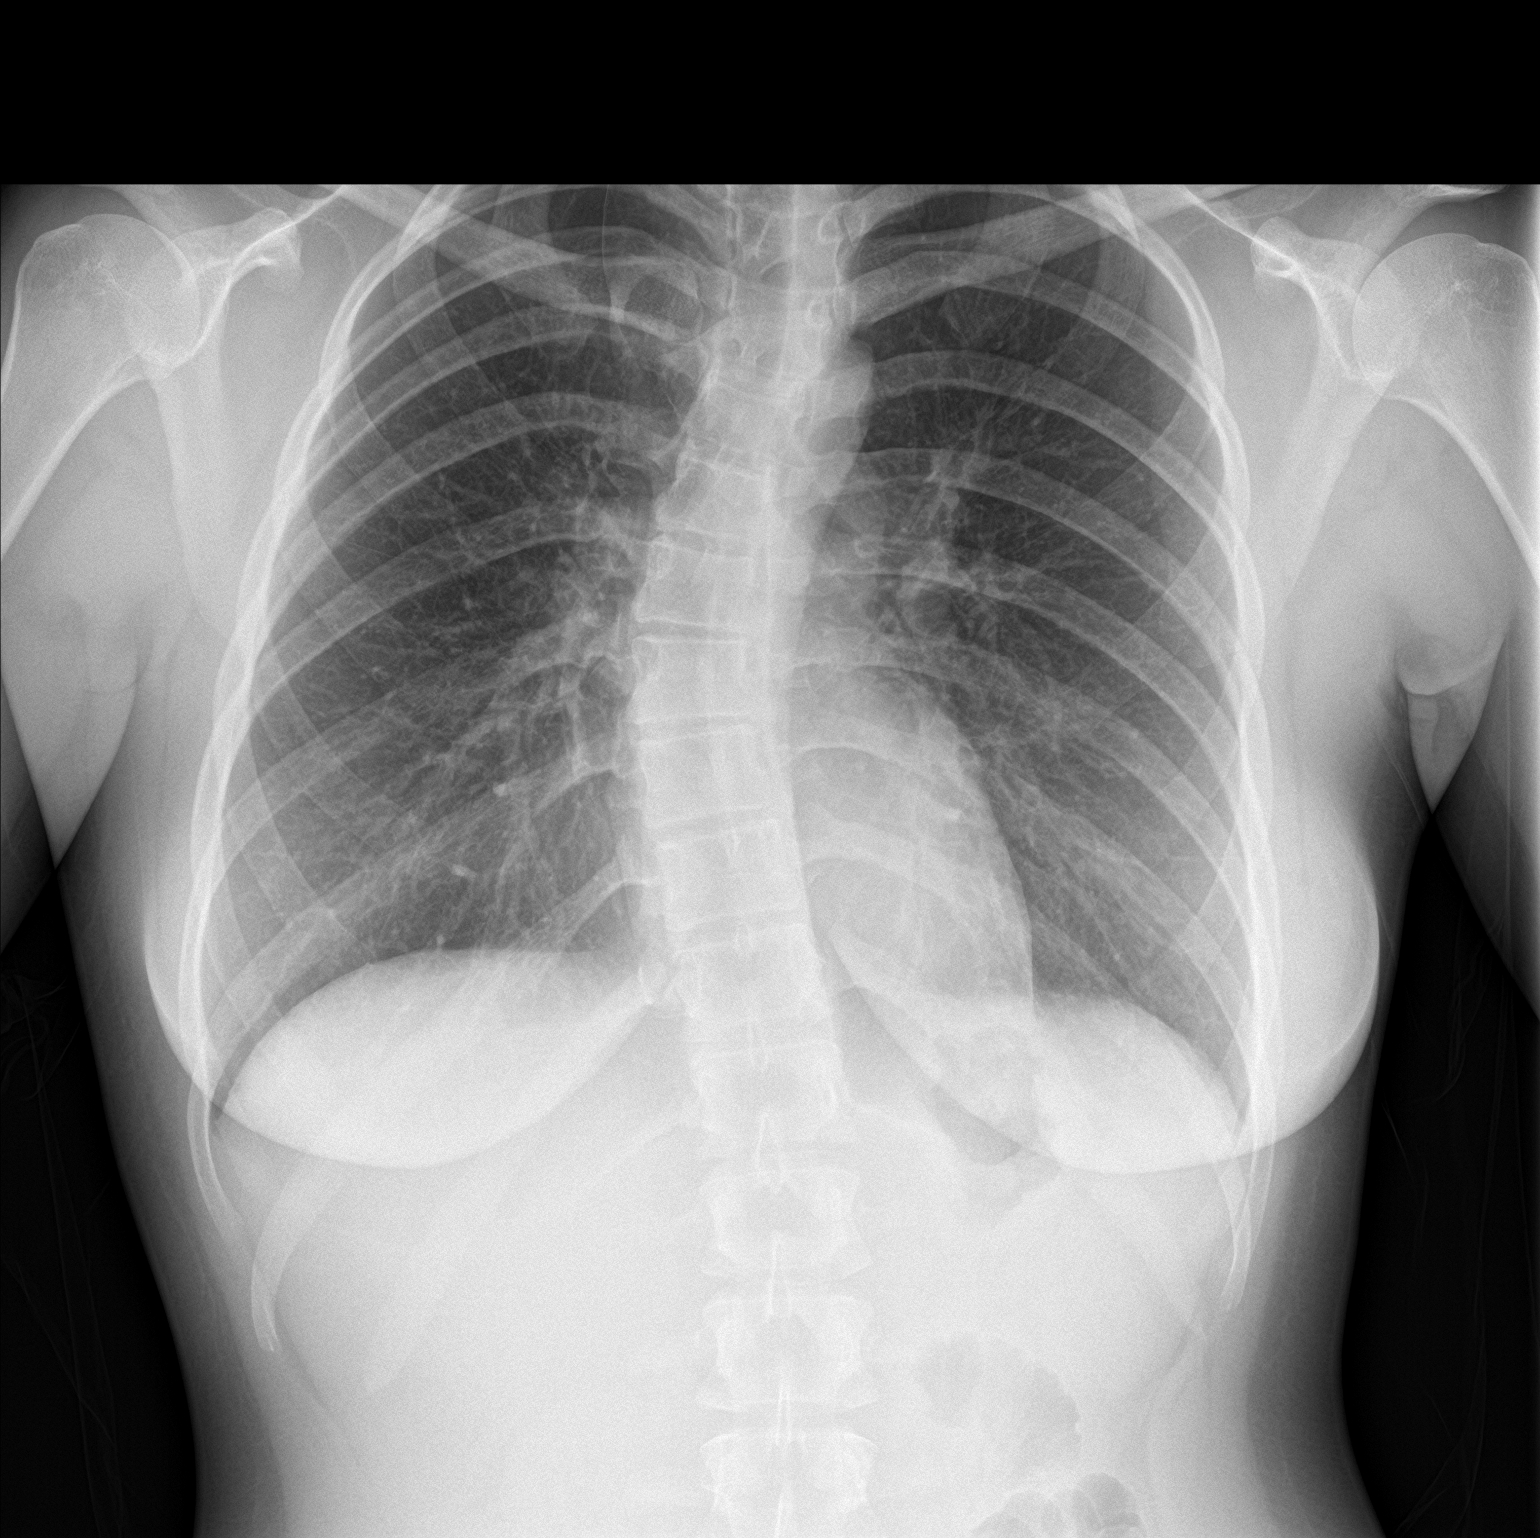

[chest lat]
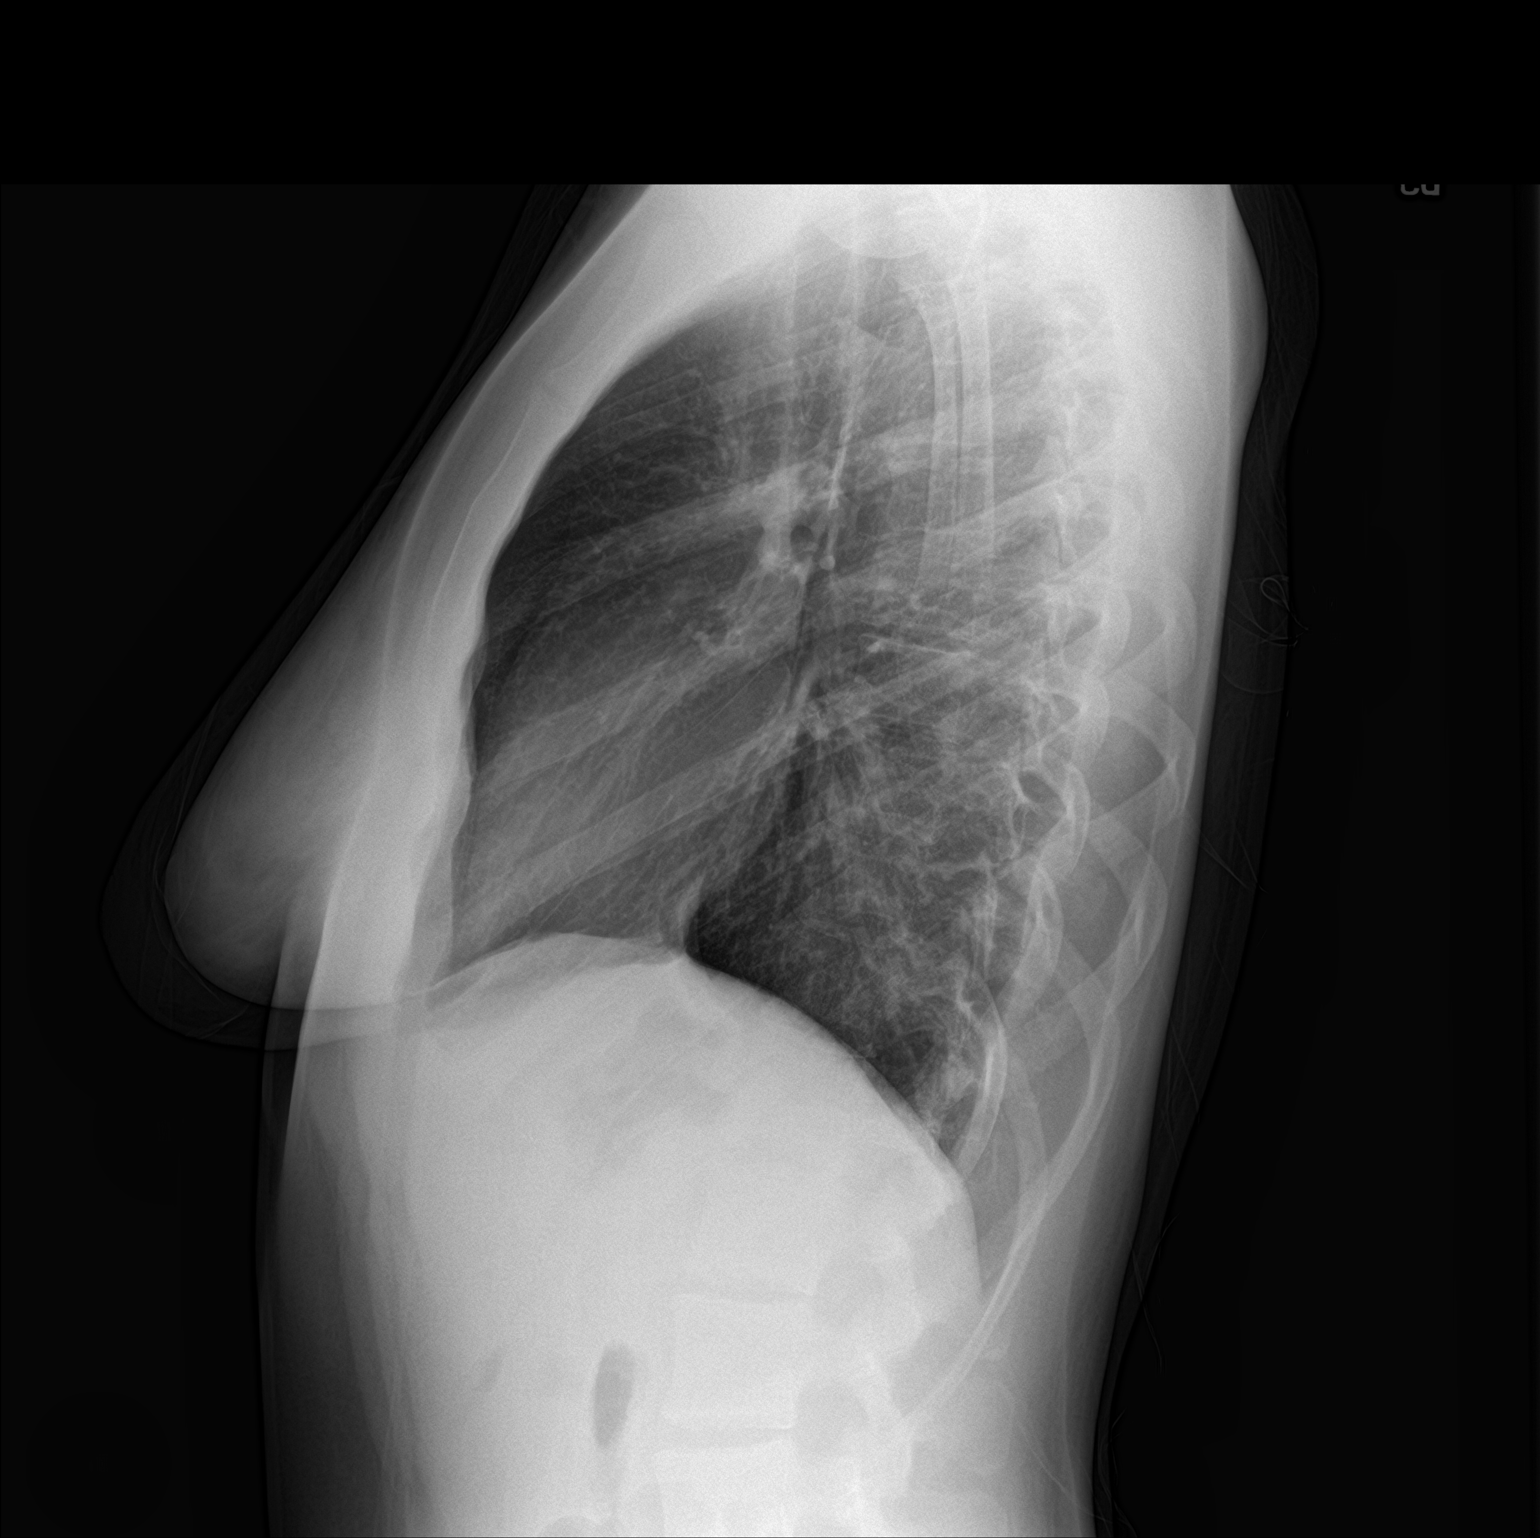

[2 of 2 positions shown; findings below may reference images not displayed]

FINDINGS: The heart size and mediastinal contours are within normal limits. No
consolidation, effusion, or pneumothorax. There is dextroscoliosis
of the thoracic spine. No acute osseous abnormality.
IMPRESSION: No active cardiopulmonary disease.

## 2024-01-17 ENCOUNTER — Emergency Department (HOSPITAL_BASED_OUTPATIENT_CLINIC_OR_DEPARTMENT_OTHER)
Admission: EM | Admit: 2024-01-17 | Discharge: 2024-01-17 | Disposition: A | Discharge status: Home / Self Care | Attending: Emergency Medicine | Admitting: Emergency Medicine

## 2024-01-17 ENCOUNTER — Encounter (HOSPITAL_BASED_OUTPATIENT_CLINIC_OR_DEPARTMENT_OTHER): Payer: Self-pay | Admitting: Emergency Medicine

## 2024-01-17 ENCOUNTER — Other Ambulatory Visit: Payer: Self-pay

## 2024-01-17 ENCOUNTER — Emergency Department (HOSPITAL_BASED_OUTPATIENT_CLINIC_OR_DEPARTMENT_OTHER)

## 2024-01-17 DIAGNOSIS — N939 Abnormal uterine and vaginal bleeding, unspecified: Secondary | ICD-10-CM | POA: Diagnosis not present

## 2024-01-17 DIAGNOSIS — J45909 Unspecified asthma, uncomplicated: Secondary | ICD-10-CM | POA: Diagnosis not present

## 2024-01-17 DIAGNOSIS — R1030 Lower abdominal pain, unspecified: Secondary | ICD-10-CM

## 2024-01-17 LAB — CBC
HCT: 38.2 % (ref 36.0–46.0)
Hemoglobin: 12.7 g/dL (ref 12.0–15.0)
MCH: 31.1 pg (ref 26.0–34.0)
MCHC: 33.2 g/dL (ref 30.0–36.0)
MCV: 93.6 fL (ref 80.0–100.0)
Platelets: 316 10*3/uL (ref 150–400)
RBC: 4.08 MIL/uL (ref 3.87–5.11)
RDW: 12.9 % (ref 11.5–15.5)
WBC: 9.8 10*3/uL (ref 4.0–10.5)
nRBC: 0 % (ref 0.0–0.2)

## 2024-01-17 LAB — COMPREHENSIVE METABOLIC PANEL WITH GFR
ALT: 15 U/L (ref 0–44)
AST: 17 U/L (ref 15–41)
Albumin: 4.1 g/dL (ref 3.5–5.0)
Alkaline Phosphatase: 48 U/L (ref 38–126)
Anion gap: 8 (ref 5–15)
BUN: 16 mg/dL (ref 6–20)
CO2: 22 mmol/L (ref 22–32)
Calcium: 9.1 mg/dL (ref 8.9–10.3)
Chloride: 108 mmol/L (ref 98–111)
Creatinine, Ser: 0.8 mg/dL (ref 0.44–1.00)
GFR, Estimated: >60 mL/min (ref >60)
Glucose, Bld: 90 mg/dL (ref 70–99)
Potassium: 4.1 mmol/L (ref 3.5–5.1)
Sodium: 138 mmol/L (ref 135–145)
Total Bilirubin: 0.6 mg/dL (ref 0.0–1.2)
Total Protein: 7.1 g/dL (ref 6.5–8.1)

## 2024-01-17 LAB — URINALYSIS, ROUTINE W REFLEX MICROSCOPIC
Bilirubin Urine: NEGATIVE
Glucose, UA: NEGATIVE mg/dL
Hgb urine dipstick: NEGATIVE
Ketones, ur: NEGATIVE mg/dL
Leukocytes,Ua: NEGATIVE
Nitrite: NEGATIVE
Protein, ur: NEGATIVE mg/dL
Specific Gravity, Urine: 1.02 (ref 1.005–1.030)
pH: 7 (ref 5.0–8.0)

## 2024-01-17 LAB — PREGNANCY, URINE: Preg Test, Ur: NEGATIVE

## 2024-01-17 MED ORDER — MELOXICAM 15 MG PO TABS: 15.0000 mg | ORAL_TABLET | Freq: Every day | ORAL | 0 refills | Status: DC | PRN

## 2024-01-17 MED ORDER — HYDROCODONE-ACETAMINOPHEN 5-325 MG PO TABS
1.0000 | ORAL_TABLET | Freq: Once | ORAL | Status: AC
  Administered 2024-01-17: 1 via ORAL
  Filled 2024-01-17: qty 1

## 2024-01-17 MED ORDER — DROPERIDOL 2.5 MG/ML IJ SOLN: 1.2500 mg | Freq: Once | INTRAMUSCULAR | Status: DC

## 2024-01-17 MED ORDER — ONDANSETRON 4 MG PO TBDP
8.0000 mg | ORAL_TABLET | Freq: Once | ORAL | Status: AC
  Administered 2024-01-17: 8 mg via ORAL
  Filled 2024-01-17: qty 2

## 2024-01-17 MED ORDER — IOHEXOL 300 MG/ML  SOLN
75.0000 mL | Freq: Once | INTRAMUSCULAR | Status: AC | PRN
  Administered 2024-01-17: 75 mL via INTRAVENOUS

## 2024-01-17 NOTE — Discharge Instructions (Addendum)
 As discussed, your workup today was overall reassuring.  Your laboratory studies were normal.  CT imaging did not show any evidence of acute abnormality.  IUD in place.  There is still some concern for uterine injury from the incident earlier today.  Take meloxicam  for pain.  Monitor for signs of bleeding.  If bleeding worsens, please return to emergency department.  Otherwise, recommend follow-up with primary care for reassessment of your symptoms.

## 2024-01-17 NOTE — ED Notes (Signed)

## 2024-01-17 NOTE — ED Notes (Signed)
 Pt has lower back pain and abd spasms from being jumped on by a large dog. Pt was struck in her abdomen with two paws and has been urinating blood 2 x since. Second instance was much reduced as far as bloody color.

## 2024-01-17 NOTE — ED Provider Notes (Signed)
 [facility] Provider Note   CSN: [id number] Arrival date & time: 01/17/24  1622     History  Chief Complaint  Patient presents with   Abdominal Pain    Makayla Obrien is a 28 y.o. female.   Abdominal Pain   29 year old female presents emergency department plaints of abdominal pain.  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  States that she was walking in the dark sprinted at her coming up to the direction but both paws on her lower middle abdomen.  Denies subsequently falling.  States that she has had lower abdominal pain with some radiation to her low back ever since then.  Does report having vaginal bleeding after the incident that seems to have slowed since onset.  States she has an IUD in place and has not had vaginal bleeding for the past 2 years.  Denies any fevers, chills, nausea, vomiting, urinary symptoms, change in bowel habits.  Has taken no medication for her symptoms.  Past medical history significant for anxiety, asthma, allergy  Home Medications Prior to Admission medications   Medication Sig Start Date End Date Taking? Authorizing Provider  meloxicam  (MOBIC ) 15 MG tablet Take 1 tablet (15 mg total) by mouth daily as needed. 01/17/24  Yes Neil Balls A, PA  albuterol (VENTOLIN HFA) 108 (90 Base) MCG/ACT inhaler Inhale into the lungs. 11/28/16   [provider]  amoxicillin  (AMOXIL ) 125 MG/5ML suspension Take 125 mg by mouth 3 (three) times daily.    [provider]  HYDROcodone -acetaminophen  (NORCO) 10-325 MG tablet Take 1 tablet by mouth every 6 (six) hours as needed.    [provider]  promethazine  (PHENERGAN ) 25 MG tablet Take 1 tablet (25 mg total) by mouth 2 (two) times daily. 05/28/22   Graciella Lavender, PA      Allergies    Patient has no known allergies.    Review of Systems   Review of Systems  Gastrointestinal:   Positive for abdominal pain.  All other systems reviewed and are negative.   Physical Exam Updated Vital Signs BP 118/81   Pulse 68   Temp 98.6 F (37 C)   Resp 20   Ht 5\' 6"  (1.676 m)   Wt 61.2 kg   SpO2 99%   BMI 21.79 kg/m  Physical Exam Vitals and nursing note reviewed.  Constitutional:      General: She is not in acute distress.    Appearance: She is well-developed.  HENT:     Head: Normocephalic and atraumatic.  Eyes:     Conjunctiva/sclera: Conjunctivae normal.  Cardiovascular:     Rate and Rhythm: Normal rate and regular rhythm.     Heart sounds: No murmur heard. Pulmonary:     Effort: Pulmonary effort is normal. No respiratory distress.     Breath sounds: Normal breath sounds.  Abdominal:     Palpations: Abdomen is soft.     Tenderness: There is abdominal tenderness in the suprapubic area. There is no right CVA tenderness, left CVA tenderness or guarding.  Musculoskeletal:        General: No swelling.     Cervical back: Neck supple.  Skin:    General: Skin is warm and dry.     Capillary Refill: Capillary refill takes less than 2 seconds.  Neurological:     Mental Status: She is alert.  Psychiatric:  Mood and Affect: Mood normal.     ED Results / Procedures / Treatments   Labs (all labs ordered are listed, but only abnormal results are displayed) Labs Reviewed  PREGNANCY, URINE  URINALYSIS, ROUTINE W REFLEX MICROSCOPIC  COMPREHENSIVE METABOLIC PANEL WITH GFR  CBC    EKG None  Radiology CT ABDOMEN PELVIS W CONTRAST Result Date: 01/17/2024 CLINICAL DATA:  Abdominal trauma, blunt EXAM: CT ABDOMEN AND PELVIS WITH CONTRAST TECHNIQUE: Multidetector CT imaging of the abdomen and pelvis was performed using the standard protocol following bolus administration of intravenous contrast. RADIATION DOSE REDUCTION: This exam was performed according to the departmental dose-optimization program which includes automated exposure control, adjustment of the mA  and/or kV according to patient size and/or use of iterative reconstruction technique. CONTRAST:  75mL OMNIPAQUE  IOHEXOL  300 MG/ML  SOLN COMPARISON:  07/17/2022 FINDINGS: Lower chest: No pleural or pericardial effusion. Visualized lung bases clear. Hepatobiliary: No focal liver lesion or biliary ductal dilatation. Gallbladder nondistended. No calcified gallstones. Pancreas: Unremarkable. No pancreatic ductal dilatation or surrounding inflammatory changes. Spleen: Normal in size without focal abnormality. Adrenals/Urinary Tract: Adrenal glands are unremarkable. Kidneys are normal, without renal calculi, focal lesion, or hydronephrosis. Bladder is unremarkable. Stomach/Bowel: Stomach and small bowel are nondistended. Staple line at the base of the cecum, post appendectomy. The colon is incompletely distended, without acute finding. Vascular/Lymphatic: No significant vascular findings are present. No enlarged abdominal or pelvic lymph nodes. Reproductive: IUD in place.  No adnexal mass. Other: No ascites.  No free air. Musculoskeletal: No acute or significant osseous findings. IMPRESSION: No acute findings. Electronically Signed   By: Nicoletta Barrier M.D.   On: 01/17/2024 19:11    Procedures Procedures    Medications Ordered in ED Medications  HYDROcodone -acetaminophen  (NORCO/VICODIN) 5-325 MG per tablet 1 tablet (1 tablet Oral Given 01/17/24 1655)  ondansetron  (ZOFRAN -ODT) disintegrating tablet 8 mg (8 mg Oral Given 01/17/24 1654)  iohexol  (OMNIPAQUE ) 300 MG/ML solution 75 mL (75 mLs Intravenous Contrast Given 01/17/24 1747)    ED Course/ Medical Decision Making/ A&P                                 Medical Decision Making Amount and/or Complexity of Data Reviewed Labs: ordered. Radiology: ordered.  Risk Prescription drug management.   This patient presents to the ED for concern of abdominal pain, this involves an extensive number of treatment options, and is a complaint that carries with it a high  risk of complications and morbidity.  The differential diagnosis includes diverticulitis, appendicitis, ectopic pregnancy, tubo-ovarian abscess, rupture, bladder rupture, other   Co morbidities that complicate the patient evaluation  See HPI   Additional history obtained:  Additional history obtained from EMR External records from outside source obtained and reviewed including hospital records   Lab Tests:  I Ordered, and personally interpreted labs.  The pertinent results include: No leukocytosis.  No evidence of anemia.  Platelets within range.  No Electra abnormalities.  No renal dysfunction.  No transaminitis.  UA without abnormality.  Urine pregnancy negative.   Imaging Studies ordered:  I ordered imaging studies including CT abdomen pelvis I independently visualized and interpreted imaging which showed no acute abnormality I agree with the radiologist interpretation   Cardiac Monitoring: / EKG:  The patient was maintained on a cardiac monitor.  I personally viewed and interpreted the cardiac monitored which showed an underlying rhythm of: Sinus rhythm   Consultations Obtained:  N/a   Problem List / ED Course / Critical interventions / Medication management  Lower abdominal pain I ordered medication including Norco, Zofran    Reevaluation of the patient after these medicines showed that the patient improved I have reviewed the patients home medicines and have made adjustments as needed   Social Determinants of Health:  Denies tobacco, licit drug use   Test / Admission - Considered:  Lower abdominal pain Vitals signs within normal range and stable throughout visit. Laboratory/imaging studies significant for: See above 28 year old female presents emergency department plaints of abdominal pain.  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  States that she was walking in the dark sprinted at her coming up to the  direction but both paws on her lower middle abdomen.  Denies subsequently falling.  States that she has had lower abdominal pain with some radiation to her low back ever since then.  Does report having vaginal bleeding after the incident that seems to have slowed since onset.  States she has an IUD in place and has not had vaginal bleeding for the past 2 years.  Denies any fevers, chills, nausea, vomiting, urinary symptoms, change in bowel habits.  Has taken no medication for her symptoms. On exam, tenderness suprapubic region.  Labs without acute emergent process.  CT imaging without evidence of any acute intra-abdominal pathology.  Suspect patient could have suffered mild uterine trauma causing short-term bleeding.  IUD appears to be in place per CT.  Patient without bleeding while in the ED.  Will recommend monitoring symptoms at home and follow-up with OB/GYN in the outpatient setting for reassessment.  Treatment plan discussed with patient and she acknowledged understanding was agreeable to said plan.  Patient will well-appearing, afebrile in no acute distress. Worrisome signs and symptoms were discussed with the patient, and the patient acknowledged understanding to return to the ED if noticed. Patient was stable upon discharge.          Final Clinical Impression(s) / ED Diagnoses Final diagnoses:  Lower abdominal pain  Vaginal bleeding    Rx / DC Orders      [place], [place] 01/17/24 2113    Tonya Fredrickson, MD 01/18/24 1036

## 2024-01-17 NOTE — ED Triage Notes (Signed)
 Pt reports dog that weighs from 80-100lb jumped on her abdomen today; c/o pain to lower abd and reports hematuria after the incident

## 2024-01-20 ENCOUNTER — Encounter (HOSPITAL_BASED_OUTPATIENT_CLINIC_OR_DEPARTMENT_OTHER): Payer: Self-pay | Admitting: Certified Nurse Midwife

## 2024-01-20 ENCOUNTER — Other Ambulatory Visit (HOSPITAL_COMMUNITY)
Admission: RE | Admit: 2024-01-20 | Discharge: 2024-01-20 | Disposition: A | Discharge status: Home / Self Care | Source: Ambulatory Visit | Attending: Certified Nurse Midwife | Admitting: Certified Nurse Midwife

## 2024-01-20 ENCOUNTER — Ambulatory Visit (HOSPITAL_BASED_OUTPATIENT_CLINIC_OR_DEPARTMENT_OTHER): Admitting: Certified Nurse Midwife

## 2024-01-20 VITALS — BP 127/74 | HR 74 | Ht 63.5 in | Wt 126.6 lb

## 2024-01-20 DIAGNOSIS — Z30431 Encounter for routine checking of intrauterine contraceptive device: Secondary | ICD-10-CM | POA: Diagnosis not present

## 2024-01-20 DIAGNOSIS — Z124 Encounter for screening for malignant neoplasm of cervix: Secondary | ICD-10-CM

## 2024-01-20 DIAGNOSIS — Z113 Encounter for screening for infections with a predominantly sexual mode of transmission: Secondary | ICD-10-CM | POA: Diagnosis not present

## 2024-01-20 NOTE — Progress Notes (Unsigned)
  GYNECOLOGY  VISIT  CC:   Discuss IUD, Pap smear, STI Screen  HPI: 28 y.o. G0P0000 Single White or Caucasian female here to discuss her Mirena IUD. She reports hx heavy menstrual bleeding at times and thinks IUD has helped reduce menstrual flow.   Past Medical History:  Diagnosis Date   Allergy    seasonal   Anxiety    Asthma     MEDS:   No current outpatient medications on file prior to visit.   Current Facility-Administered Medications on File Prior to Visit  Medication Dose Route Frequency Provider Last Rate Last Admin   0.9 %  sodium chloride  infusion  500 mL Intravenous Continuous Lindle Rhea, MD        ALLERGIES: Patient has no known allergies.  Review of Systems  Constitutional: Negative.   Genitourinary: Negative.   All other systems reviewed and are negative.  PHYSICAL EXAMINATION:    BP 127/74 (BP Location: Right Arm, Patient Position: Sitting, Cuff Size: Normal)   Pulse 74   Ht 5' 3.5" (1.613 m)   Wt 126 lb 9.6 oz (57.4 kg)   BMI 22.07 kg/m     General appearance: alert, cooperative and appears stated age  Pelvic: External genitalia:  no lesions              Urethra:  normal appearing urethra with no masses, tenderness or lesions              Bartholins and Skenes: normal                 Vagina: normal without tenderness, induration or masses              Cervix: no bleeding following Pap, no cervical motion tenderness, no lesions, and nulliparous appearance IUD strings visible 2cm              Bimanual Exam:  Uterus:  normal size, contour, position, consistency, mobility, non-tender              Adnexa: no mass, fullness, tenderness             Anus:  normal sphincter tone, no lesions  Chaperone,  CMA, was present for exam.  Assessment/Plan:  1. Cervical cancer screening (Primary) - Cytology - PAP( [facility])  2. Contraceptive, surveillance, intrauterine device - IUD string check completed  3. Routine screening for STI (sexually  transmitted infection)  RTO 1 year for annual gyn exam and IUD string check and prn if issues arise. Yolanda Hence

## 2024-01-22 DIAGNOSIS — Z124 Encounter for screening for malignant neoplasm of cervix: Secondary | ICD-10-CM | POA: Insufficient documentation

## 2024-01-22 DIAGNOSIS — Z113 Encounter for screening for infections with a predominantly sexual mode of transmission: Secondary | ICD-10-CM | POA: Insufficient documentation

## 2024-01-22 DIAGNOSIS — Z30431 Encounter for routine checking of intrauterine contraceptive device: Secondary | ICD-10-CM | POA: Insufficient documentation

## 2024-01-22 LAB — CYTOLOGY - PAP
Adequacy: ABSENT
Chlamydia: NEGATIVE
Comment: NEGATIVE
Comment: NEGATIVE
Comment: NEGATIVE
Comment: NORMAL
Diagnosis: NEGATIVE
High risk HPV: NEGATIVE
Neisseria Gonorrhea: NEGATIVE
Trichomonas: NEGATIVE

## 2024-08-10 ENCOUNTER — Emergency Department (HOSPITAL_BASED_OUTPATIENT_CLINIC_OR_DEPARTMENT_OTHER)

## 2024-08-10 ENCOUNTER — Encounter (HOSPITAL_BASED_OUTPATIENT_CLINIC_OR_DEPARTMENT_OTHER): Payer: Self-pay

## 2024-08-10 ENCOUNTER — Emergency Department (HOSPITAL_BASED_OUTPATIENT_CLINIC_OR_DEPARTMENT_OTHER)
Admission: EM | Admit: 2024-08-10 | Discharge: 2024-08-10 | Disposition: A | Discharge status: Home / Self Care | Attending: Emergency Medicine | Admitting: Emergency Medicine

## 2024-08-10 ENCOUNTER — Other Ambulatory Visit: Payer: Self-pay

## 2024-08-10 DIAGNOSIS — R059 Cough, unspecified: Secondary | ICD-10-CM | POA: Diagnosis not present

## 2024-08-10 DIAGNOSIS — R112 Nausea with vomiting, unspecified: Secondary | ICD-10-CM | POA: Insufficient documentation

## 2024-08-10 DIAGNOSIS — J351 Hypertrophy of tonsils: Secondary | ICD-10-CM | POA: Insufficient documentation

## 2024-08-10 DIAGNOSIS — J029 Acute pharyngitis, unspecified: Secondary | ICD-10-CM | POA: Diagnosis present

## 2024-08-10 DIAGNOSIS — D72829 Elevated white blood cell count, unspecified: Secondary | ICD-10-CM | POA: Insufficient documentation

## 2024-08-10 DIAGNOSIS — J45909 Unspecified asthma, uncomplicated: Secondary | ICD-10-CM | POA: Insufficient documentation

## 2024-08-10 LAB — URINALYSIS, MICROSCOPIC (REFLEX)

## 2024-08-10 LAB — CBC WITH DIFFERENTIAL/PLATELET
Abs Immature Granulocytes: 0.36 K/uL — ABNORMAL HIGH (ref 0.00–0.07)
Basophils Absolute: 0.1 K/uL (ref 0.0–0.1)
Basophils Relative: 0 %
Eosinophils Absolute: 0 K/uL (ref 0.0–0.5)
Eosinophils Relative: 0 %
HCT: 40 % (ref 36.0–46.0)
Hemoglobin: 13.6 g/dL (ref 12.0–15.0)
Immature Granulocytes: 1 %
Lymphocytes Relative: 8 %
Lymphs Abs: 2.9 K/uL (ref 0.7–4.0)
MCH: 31.7 pg (ref 26.0–34.0)
MCHC: 34 g/dL (ref 30.0–36.0)
MCV: 93.2 fL (ref 80.0–100.0)
Monocytes Absolute: 3.5 K/uL — ABNORMAL HIGH (ref 0.1–1.0)
Monocytes Relative: 9 %
Neutro Abs: 32 K/uL — ABNORMAL HIGH (ref 1.7–7.7)
Neutrophils Relative %: 82 %
Platelets: 383 K/uL (ref 150–400)
RBC: 4.29 MIL/uL (ref 3.87–5.11)
RDW: 12.6 % (ref 11.5–15.5)
Smear Review: NORMAL
WBC: 38.9 K/uL — ABNORMAL HIGH (ref 4.0–10.5)
nRBC: 0 % (ref 0.0–0.2)

## 2024-08-10 LAB — LIPASE, BLOOD: Lipase: 11 U/L (ref 11–51)

## 2024-08-10 LAB — COMPREHENSIVE METABOLIC PANEL WITH GFR
ALT: 19 U/L (ref 0–44)
AST: 17 U/L (ref 15–41)
Albumin: 4.6 g/dL (ref 3.5–5.0)
Alkaline Phosphatase: 70 U/L (ref 38–126)
Anion gap: 14 (ref 5–15)
BUN: 10 mg/dL (ref 6–20)
CO2: 24 mmol/L (ref 22–32)
Calcium: 9.6 mg/dL (ref 8.9–10.3)
Chloride: 100 mmol/L (ref 98–111)
Creatinine, Ser: 0.84 mg/dL (ref 0.44–1.00)
GFR, Estimated: >60 mL/min (ref >60)
Glucose, Bld: 119 mg/dL — ABNORMAL HIGH (ref 70–99)
Potassium: 3.7 mmol/L (ref 3.5–5.1)
Sodium: 137 mmol/L (ref 135–145)
Total Bilirubin: 0.7 mg/dL (ref 0.0–1.2)
Total Protein: 7.4 g/dL (ref 6.5–8.1)

## 2024-08-10 LAB — URINALYSIS, ROUTINE W REFLEX MICROSCOPIC
Bilirubin Urine: NEGATIVE
Glucose, UA: NEGATIVE mg/dL
Ketones, ur: 15 mg/dL — AB
Leukocytes,Ua: NEGATIVE
Nitrite: NEGATIVE
Protein, ur: NEGATIVE mg/dL
Specific Gravity, Urine: 1.015 (ref 1.005–1.030)
pH: 7.5 (ref 5.0–8.0)

## 2024-08-10 LAB — LACTIC ACID, PLASMA
Lactic Acid, Venous: 0.9 mmol/L (ref 0.5–1.9)
Lactic Acid, Venous: 0.9 mmol/L (ref 0.5–1.9)

## 2024-08-10 LAB — RESP PANEL BY RT-PCR (RSV, FLU A&B, COVID)  RVPGX2
Influenza A by PCR: NEGATIVE
Influenza B by PCR: NEGATIVE
Resp Syncytial Virus by PCR: NEGATIVE
SARS Coronavirus 2 by RT PCR: NEGATIVE

## 2024-08-10 LAB — GROUP A STREP BY PCR: Group A Strep by PCR: NOT DETECTED

## 2024-08-10 LAB — HCG, SERUM, QUALITATIVE: Preg, Serum: NEGATIVE

## 2024-08-10 MED ORDER — DEXAMETHASONE SOD PHOSPHATE PF 10 MG/ML IJ SOLN
10.0000 mg | Freq: Once | INTRAMUSCULAR | Status: AC
  Administered 2024-08-10: 10 mg via INTRAVENOUS

## 2024-08-10 MED ORDER — ONDANSETRON 4 MG PO TBDP: 4.0000 mg | ORAL_TABLET | Freq: Three times a day (TID) | ORAL | 0 refills | Status: AC | PRN

## 2024-08-10 MED ORDER — SODIUM CHLORIDE 0.9 % IV BOLUS
1000.0000 mL | Freq: Once | INTRAVENOUS | Status: AC
  Administered 2024-08-10: 1000 mL via INTRAVENOUS

## 2024-08-10 MED ORDER — ONDANSETRON HCL 4 MG/2ML IJ SOLN
4.0000 mg | Freq: Once | INTRAMUSCULAR | Status: AC
  Administered 2024-08-10: 4 mg via INTRAVENOUS
  Filled 2024-08-10: qty 2

## 2024-08-10 MED ORDER — BENZONATATE 100 MG PO CAPS: 100.0000 mg | ORAL_CAPSULE | Freq: Three times a day (TID) | ORAL | 0 refills | Status: AC

## 2024-08-10 MED ORDER — CLINDAMYCIN PHOSPHATE 300 MG/50ML IV SOLN
300.0000 mg | Freq: Once | INTRAVENOUS | Status: AC
  Administered 2024-08-10: 300 mg via INTRAVENOUS
  Filled 2024-08-10: qty 50

## 2024-08-10 MED ORDER — KETOROLAC TROMETHAMINE 15 MG/ML IJ SOLN
15.0000 mg | Freq: Once | INTRAMUSCULAR | Status: AC
  Administered 2024-08-10: 15 mg via INTRAVENOUS
  Filled 2024-08-10: qty 1

## 2024-08-10 MED ORDER — CLINDAMYCIN HCL 300 MG PO CAPS: 300.0000 mg | ORAL_CAPSULE | Freq: Three times a day (TID) | ORAL | 0 refills | Status: AC

## 2024-08-10 MED ORDER — IOHEXOL 300 MG/ML  SOLN
100.0000 mL | Freq: Once | INTRAMUSCULAR | Status: AC | PRN
  Administered 2024-08-10: 75 mL via INTRAVENOUS

## 2024-08-10 NOTE — Discharge Instructions (Signed)
 It was a pleasure taking care of you today. As discussed, you CT showed enlarged tonsils; however no abscess. I am sending you home with antibiotics. Take as prescribed and finish all antibiotics. Return to the ED for neck stiffness, difficulties breathing, worsening symptoms, or anything concerning. Your white blood cell count was elevated. Please have repeat labs this week by PCP. Return to the ER for new or worsening symptoms.

## 2024-08-10 NOTE — ED Provider Notes (Signed)
 [facility] Provider Note   CSN: [id number] Arrival date & time: 08/10/24  1009     Patient presents with: Sore Throat   Makayla Obrien is a 28 y.o. female with a past medical history significant for asthma and anxiety who presents to the ED due to sore throat, nasal congestion, myalgias, and cough x 1 week.  Also endorses nausea and vomiting x 3 days.  Admits to numerous episodes of nonbloody, nonbilious emesis.  Endorses generalized abdominal pain.  No diarrhea.  Denies fever and chills.  Admits to pain with swallowing however, denies difficulties swallowing.  No shortness of breath.  Denies wheeze.  Mother sick with similar symptoms. No neck stiffness.   History obtained from patient and past medical records. No interpreter used during encounter.       Prior to Admission medications   Medication Sig Start Date End Date Taking? Authorizing Provider  benzonatate (TESSALON) 100 MG capsule Take 1 capsule (100 mg total) by mouth every 8 (eight) hours. 08/10/24  Yes Jenavi Beedle, Aleck BROCKS, PA-C  clindamycin (CLEOCIN) 300 MG capsule Take 1 capsule (300 mg total) by mouth 3 (three) times daily for 10 days. 08/10/24 08/20/24 Yes Jettie Mannor, Aleck BROCKS, PA-C  ondansetron  (ZOFRAN -ODT) 4 MG disintegrating tablet Take 1 tablet (4 mg total) by mouth every 8 (eight) hours as needed for nausea or vomiting. 08/10/24  Yes Duncan Alejandro C, PA-C    Allergies: Patient has no known allergies.    Review of Systems  Constitutional:  Positive for chills. Negative for fever.  HENT:  Positive for sore throat. Negative for trouble swallowing and voice change.   Respiratory:  Positive for cough. Negative for shortness of breath.   Cardiovascular:  Negative for chest pain.  Gastrointestinal:  Positive for abdominal pain, nausea and vomiting. Negative for diarrhea.  Musculoskeletal:  Positive for myalgias.    Updated Vital Signs BP 110/60 (BP Location: Left  Arm)   Pulse 62   Temp 98.5 F (36.9 C) (Oral)   Resp 14   SpO2 98%   Physical Exam Vitals and nursing note reviewed.  Constitutional:      General: She is not in acute distress.    Appearance: She is not ill-appearing.  HENT:     Head: Normocephalic.     Mouth/Throat:     Comments: Posterior oropharynx clear and mucous membranes moist, there is mild erythema. 2+ tonsillar hypertrophy, no exudates, uvula midline, normal phonation, no trismus, tolerating secretions without difficulty.   Eyes:     Pupils: Pupils are equal, round, and reactive to light.  Cardiovascular:     Rate and Rhythm: Normal rate and regular rhythm.     Pulses: Normal pulses.     Heart sounds: Normal heart sounds. No murmur heard.    No friction rub. No gallop.  Pulmonary:     Effort: Pulmonary effort is normal.     Breath sounds: Normal breath sounds.  Abdominal:     General: Abdomen is flat. There is no distension.     Palpations: Abdomen is soft.     Tenderness: There is abdominal tenderness. There is no guarding or rebound.     Comments: Diffuse tenderness. No rebound or guarding.   Musculoskeletal:        General: Normal range of motion.     Cervical back: Normal range of motion and neck supple.  Skin:    General: Skin is warm and dry.  Neurological:  General: No focal deficit present.     Mental Status: She is alert.  Psychiatric:        Mood and Affect: Mood normal.        Behavior: Behavior normal.     (all labs ordered are listed, but only abnormal results are displayed) Labs Reviewed  CBC WITH DIFFERENTIAL/PLATELET - Abnormal; Notable for the following components:      Result Value   WBC 38.9 (*)    Neutro Abs 32.0 (*)    Monocytes Absolute 3.5 (*)    Abs Immature Granulocytes 0.36 (*)    All other components within normal limits  COMPREHENSIVE METABOLIC PANEL WITH GFR - Abnormal; Notable for the following components:   Glucose, Bld 119 (*)    All other components within  normal limits  URINALYSIS, ROUTINE W REFLEX MICROSCOPIC - Abnormal; Notable for the following components:   Hgb urine dipstick TRACE (*)    Ketones, ur 15 (*)    All other components within normal limits  URINALYSIS, MICROSCOPIC (REFLEX) - Abnormal; Notable for the following components:   Bacteria, UA RARE (*)    All other components within normal limits  GROUP A STREP BY PCR  RESP PANEL BY RT-PCR (RSV, FLU A&B, COVID)  [id number]  CULTURE, BLOOD (ROUTINE X 2)  CULTURE, BLOOD (ROUTINE X 2)  LIPASE, BLOOD  LACTIC ACID, PLASMA  HCG, SERUM, QUALITATIVE  LACTIC ACID, PLASMA  PATHOLOGIST SMEAR REVIEW  LACTIC ACID, PLASMA    EKG: None  Radiology: DG Chest Portable 1 View Result Date: 08/10/2024 EXAM: 1 VIEW(S) XRAY OF THE CHEST 08/10/2024 04:33:40 PM COMPARISON: None available. CLINICAL HISTORY: cough FINDINGS: LUNGS AND PLEURA: No focal pulmonary opacity. No pulmonary edema. No pleural effusion. No pneumothorax. HEART AND MEDIASTINUM: No acute abnormality of the cardiac and mediastinal silhouettes. BONES AND SOFT TISSUES: Moderate thoracic dextroscoliosis. No acute osseous abnormality. IMPRESSION: 1. No acute cardiopulmonary abnormality. Electronically signed by: Rogelia Myers MD 08/10/2024 04:36 PM EST RP Workstation: [id number]   CT Soft Tissue Neck W Contrast Result Date: 08/10/2024 EXAM: CT NECK WITH CONTRAST 08/10/2024 02:04:39 PM TECHNIQUE: CT of the neck was performed with the administration of 75 mL of iohexol  (OMNIPAQUE ) 300 MG/ML solution. Multiplanar reformatted images are provided for review. Automated exposure control, iterative reconstruction, and/or weight based adjustment of the mA/kV was utilized to reduce the radiation dose to as low as reasonably achievable. COMPARISON: None available. CLINICAL HISTORY: Rule out abscess. Sore throat, congestion, body aches, and cough for 1 week. FINDINGS: AERODIGESTIVE TRACT: Moderate symmetric enlargement of the posterior  nasopharyngeal/adenoidal soft tissues. Mild symmetric enlargement of the palatine tonsillar soft tissues. No discrete mass. No peritonsillar or retropharyngeal fluid collection. No epiglottic edema. Widely patent airway. SALIVARY GLANDS: The parotid and submandibular glands are unremarkable. THYROID: Unremarkable. LYMPH NODES: Borderline enlarged bilateral level 2 and 3 lymph nodes, likely reactive. SOFT TISSUES: No mass or fluid collection. BRAIN, ORBITS, SINUSES AND MASTOIDS: No acute abnormality. LUNGS AND MEDIASTINUM: No acute abnormality. BONES: No focal bone abnormality. IMPRESSION: 1. Symmetric tonsillar hypertrophy. No abscess. Electronically signed by: Dasie Hamburg MD 08/10/2024 03:12 PM EST RP Workstation: [id number]     Procedures   Medications Ordered in the ED  sodium chloride  0.9 % bolus 1,000 mL (0 mLs Intravenous Stopped 08/10/24 1155)  dexamethasone (DECADRON) injection 10 mg (10 mg Intravenous Given 08/10/24 1051)  ketorolac  (TORADOL ) 15 MG/ML injection 15 mg (15 mg Intravenous Given 08/10/24 1052)  ondansetron  (ZOFRAN ) injection 4 mg (4 mg Intravenous Given 08/10/24 1114)  iohexol  (OMNIPAQUE ) 300 MG/ML solution 100 mL (75 mLs Intravenous Contrast Given 08/10/24 1340)  sodium chloride  0.9 % bolus 1,000 mL (0 mLs Intravenous Stopped 08/10/24 1636)  clindamycin (CLEOCIN) IVPB 300 mg (300 mg Intravenous New Bag/Given 08/10/24 1704)    Clinical Course as of 08/10/24 1741  Tue Aug 10, 2024  1136 Creatinine: 0.84 [CA]  1136 BUN: 10 [CA]  1204 WBC(!): 38.9 Added lactic acid and blood cultures given leukocytosis [CA]  1534 Called radiology to inquire about CT read. CT had been read by radiologist; however has not crossed over.  [CA]  1555 Called [place] radiology to inquire about CT read again.  Radiology has read report which read bilateral tonsillar hypertrophy without abscess.  radiology is working on getting report crossed over. [CA]    Clinical Course User Index [CA]  Lorelle Aleck BROCKS, PA-C                                 Medical Decision Making Amount and/or Complexity of Data Reviewed Independent Historian: parent Labs: ordered. Decision-making details documented in ED Course. Radiology: ordered and independent interpretation performed. Decision-making details documented in ED Course.  Risk Prescription drug management.   This patient presents to the ED for concern of sore throat, N, V, this involves an extensive number of treatment options, and is a complaint that carries with it a high risk of complications and morbidity.  The differential diagnosis includes strep throat, viral process, electrolyte abnormality, pancreatitis, gastroenteritis, etc  28 year old female presents to the ED due to sore throat, nasal congestion, myalgias, and cough x 1 week.  Developed nausea and vomiting x 3 days.  Admits to numerous episodes of nonbloody, nonbilious emesis.  Mother sick with similar symptoms.  Notes she is unable to tolerate p.o.  Upon arrival, vitals all within normal limits.  Patient in no acute distress.  No meningismus to suggest meningitis.  Abdomen soft, nondistended with diffuse tenderness.  No focal tenderness.  No rebound or guarding.  Low suspicion for acute abdomen so will hold off on CT imaging at this time.  Throat with mild erythema and 2+ tonsillar hypertrophy.  No exudates.  Uvula midline.  No abscess.  Normal phonation.  Tolerating oral secretions without difficulty.  Lungs clear to auscultation bilaterally without stridor or wheeze.  No signs of airway compromise.  RVP to rule out infection.  Strep test.  Routine labs given numerous episodes of emesis.  IV fluids, Decadron, Toradol , and Zofran .   Strep negative.  RVP negative.  CBC with significant leukocytosis at 38.9.  Added lactic acid and blood cultures.  CMP with normal renal function.  No major electrolyte derangements.  Lipase normal.  Low suspicion for pancreatitis.  Given  significant tonsillar hypertrophy and leukocytosis will obtain CT neck to rule out abscess or further infection.  Lactic acid normal.  UA with trace hematuria.  No signs of infection.  Pregnancy test negative.  CT soft tissue neck demonstrates bilateral tonsillar hypertrophy however, no abscess.  Chest x-ray personally reviewed and interpreted negative for signs of pneumonia.  Given patient's main complaint is sore throat suspect symptoms likely related to pharyngitis.  Patient given dose of IV clindamycin here in the ED.  Will discharge patient with clindamycin.  Blood cultures pending.  Patient made aware that she needs to return if her blood cultures are positive.  Low suspicion for meningitis given no meningismus on exam.  Abdomen soft,  nondistended with diffuse tenderness.  Low suspicion for acute abdomen.  Patient denies any vaginal symptoms.  Advised patient to have a recheck by PCP in 2 to 3 days and have lab work rechecked.  Discussed with Dr. Darra who evaluated patient at bedside and agrees with assessment and plan.  Given patient is well-appearing on exam and vitals have remained stable feel patient is stable to be discharged home with oral antibiotics. Strict ED precautions discussed with patient. Patient states understanding and agrees to plan. Patient discharged home in no acute distress and stable vitals  Co morbidities that complicate the patient evaluation  asthma   Social Determinants of Health:  Has PCP  Test / Admission - Considered:  Considered admission; however patient well appearing, stable vitals. Stable for discharge with po antibiotics     Final diagnoses:  Pharyngitis, unspecified etiology  Leukocytosis, unspecified type  Nausea and vomiting, unspecified vomiting type    ED Discharge Orders          Ordered    clindamycin (CLEOCIN) 300 MG capsule  3 times daily        08/10/24 1739    ondansetron  (ZOFRAN -ODT) 4 MG disintegrating tablet  Every 8 hours PRN         08/10/24 1739    benzonatate (TESSALON) 100 MG capsule  Every 8 hours        08/10/24 1739               Lorelle Aleck BROCKS, PA-C 08/10/24 1741    Long, Joshua G, MD 08/17/24 [phone]

## 2024-08-10 NOTE — ED Triage Notes (Signed)
 Sore throat, congestion, body aches, cough for 1 week.   Mother has similar symptoms

## 2024-08-11 LAB — PATHOLOGIST SMEAR REVIEW

## 2024-08-15 LAB — CULTURE, BLOOD (ROUTINE X 2)
Culture: NO GROWTH
Culture: NO GROWTH
Special Requests: ADEQUATE
